# Patient Record
Sex: Male | Born: 1955
Health system: Southern US, Community
[De-identification: ages and names within clinical notes are randomized; demographics above are authoritative.]

## PROBLEM LIST (undated history)

## (undated) DIAGNOSIS — I82409 Acute embolism and thrombosis of unspecified deep veins of unspecified lower extremity: Secondary | ICD-10-CM

## (undated) DIAGNOSIS — R7302 Impaired glucose tolerance (oral): Secondary | ICD-10-CM

## (undated) DIAGNOSIS — E669 Obesity, unspecified: Principal | ICD-10-CM

## (undated) DIAGNOSIS — G4730 Sleep apnea, unspecified: Secondary | ICD-10-CM

## (undated) DIAGNOSIS — I10 Essential (primary) hypertension: Secondary | ICD-10-CM

## (undated) DIAGNOSIS — E785 Hyperlipidemia, unspecified: Secondary | ICD-10-CM

## (undated) DIAGNOSIS — E039 Hypothyroidism, unspecified: Secondary | ICD-10-CM

## (undated) DIAGNOSIS — F419 Anxiety disorder, unspecified: Secondary | ICD-10-CM

## (undated) DIAGNOSIS — E119 Type 2 diabetes mellitus without complications: Secondary | ICD-10-CM

## (undated) DIAGNOSIS — G629 Polyneuropathy, unspecified: Secondary | ICD-10-CM

## (undated) DIAGNOSIS — M1990 Unspecified osteoarthritis, unspecified site: Secondary | ICD-10-CM

## (undated) DIAGNOSIS — N433 Hydrocele, unspecified: Secondary | ICD-10-CM

## (undated) HISTORY — DX: Sleep apnea, unspecified: G47.30

## (undated) HISTORY — DX: Polyneuropathy, unspecified: G62.9

## (undated) HISTORY — DX: Obesity, unspecified: E66.9

## (undated) HISTORY — DX: Essential (primary) hypertension: I10

## (undated) HISTORY — DX: Impaired glucose tolerance (oral): R73.02

## (undated) HISTORY — PX: TONSILLECTOMY: SUR1361

## (undated) HISTORY — PX: SHOULDER SURGERY: SHX246

## (undated) HISTORY — DX: Acute embolism and thrombosis of unspecified deep veins of unspecified lower extremity: I82.409

## (undated) HISTORY — DX: Anxiety disorder, unspecified: F41.9

## (undated) HISTORY — DX: Unspecified osteoarthritis, unspecified site: M19.90

## (undated) HISTORY — DX: Hyperlipidemia, unspecified: E78.5

## (undated) HISTORY — PX: OTHER SURGICAL HISTORY: SHX169

---

## 1990-05-24 DIAGNOSIS — X58XXXA Exposure to other specified factors, initial encounter: Secondary | ICD-10-CM

## 1990-05-24 HISTORY — PX: SHOULDER SURGERY: SHX246

## 1990-05-24 HISTORY — DX: Exposure to other specified factors, initial encounter: X58.XXXA

## 1998-10-30 ENCOUNTER — Ambulatory Visit (HOSPITAL_BASED_OUTPATIENT_CLINIC_OR_DEPARTMENT_OTHER): Admission: RE | Admit: 1998-10-30 | Discharge: 1998-10-30 | Payer: Self-pay | Admitting: Surgery

## 2001-03-21 ENCOUNTER — Ambulatory Visit (HOSPITAL_BASED_OUTPATIENT_CLINIC_OR_DEPARTMENT_OTHER): Admission: RE | Admit: 2001-03-21 | Discharge: 2001-03-21 | Payer: Self-pay | Admitting: Otolaryngology

## 2003-10-16 ENCOUNTER — Ambulatory Visit (HOSPITAL_COMMUNITY): Admission: RE | Admit: 2003-10-16 | Discharge: 2003-10-16 | Payer: Self-pay | Admitting: Orthopedic Surgery

## 2003-12-10 ENCOUNTER — Ambulatory Visit (HOSPITAL_BASED_OUTPATIENT_CLINIC_OR_DEPARTMENT_OTHER): Admission: RE | Admit: 2003-12-10 | Discharge: 2003-12-10 | Payer: Self-pay | Admitting: Orthopedic Surgery

## 2005-10-25 ENCOUNTER — Encounter: Admission: RE | Admit: 2005-10-25 | Discharge: 2005-10-25 | Payer: Self-pay | Admitting: Cardiology

## 2005-10-29 ENCOUNTER — Ambulatory Visit (HOSPITAL_COMMUNITY): Admission: RE | Admit: 2005-10-29 | Discharge: 2005-10-29 | Payer: Self-pay | Admitting: Cardiology

## 2005-12-17 ENCOUNTER — Ambulatory Visit (HOSPITAL_COMMUNITY): Admission: RE | Admit: 2005-12-17 | Discharge: 2005-12-17 | Payer: Self-pay | Admitting: Cardiology

## 2006-02-04 ENCOUNTER — Ambulatory Visit: Payer: Self-pay | Admitting: Pulmonary Disease

## 2007-08-08 IMAGING — CR DG CHEST 2V
2 series · 2 of 2 positions shown · non-contrast
Comparison: None.

CLINICAL DATA: Preop respiratory exam for cardiac cath.

 TWO VIEW CHEST:

[w chest pa]
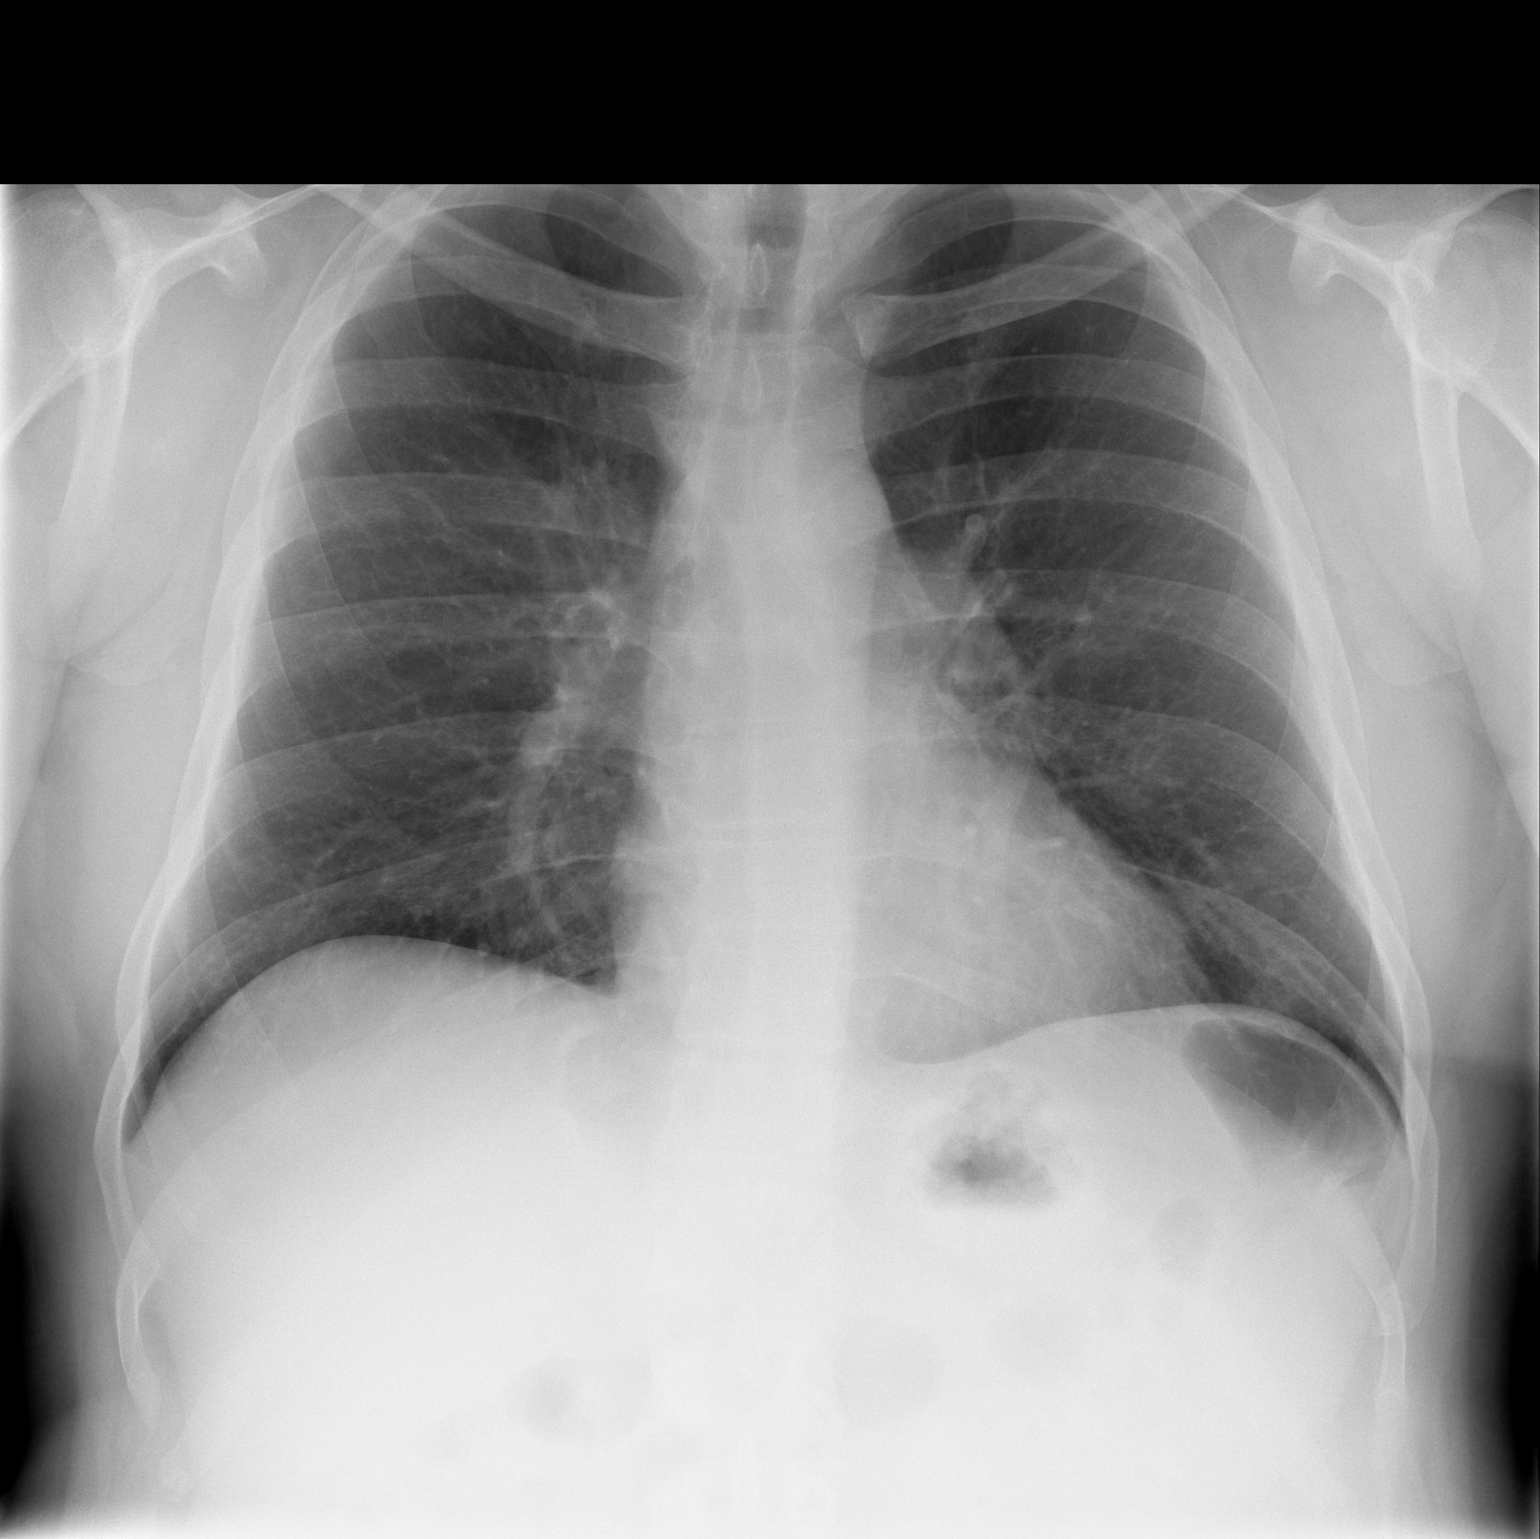

[w chest lat]
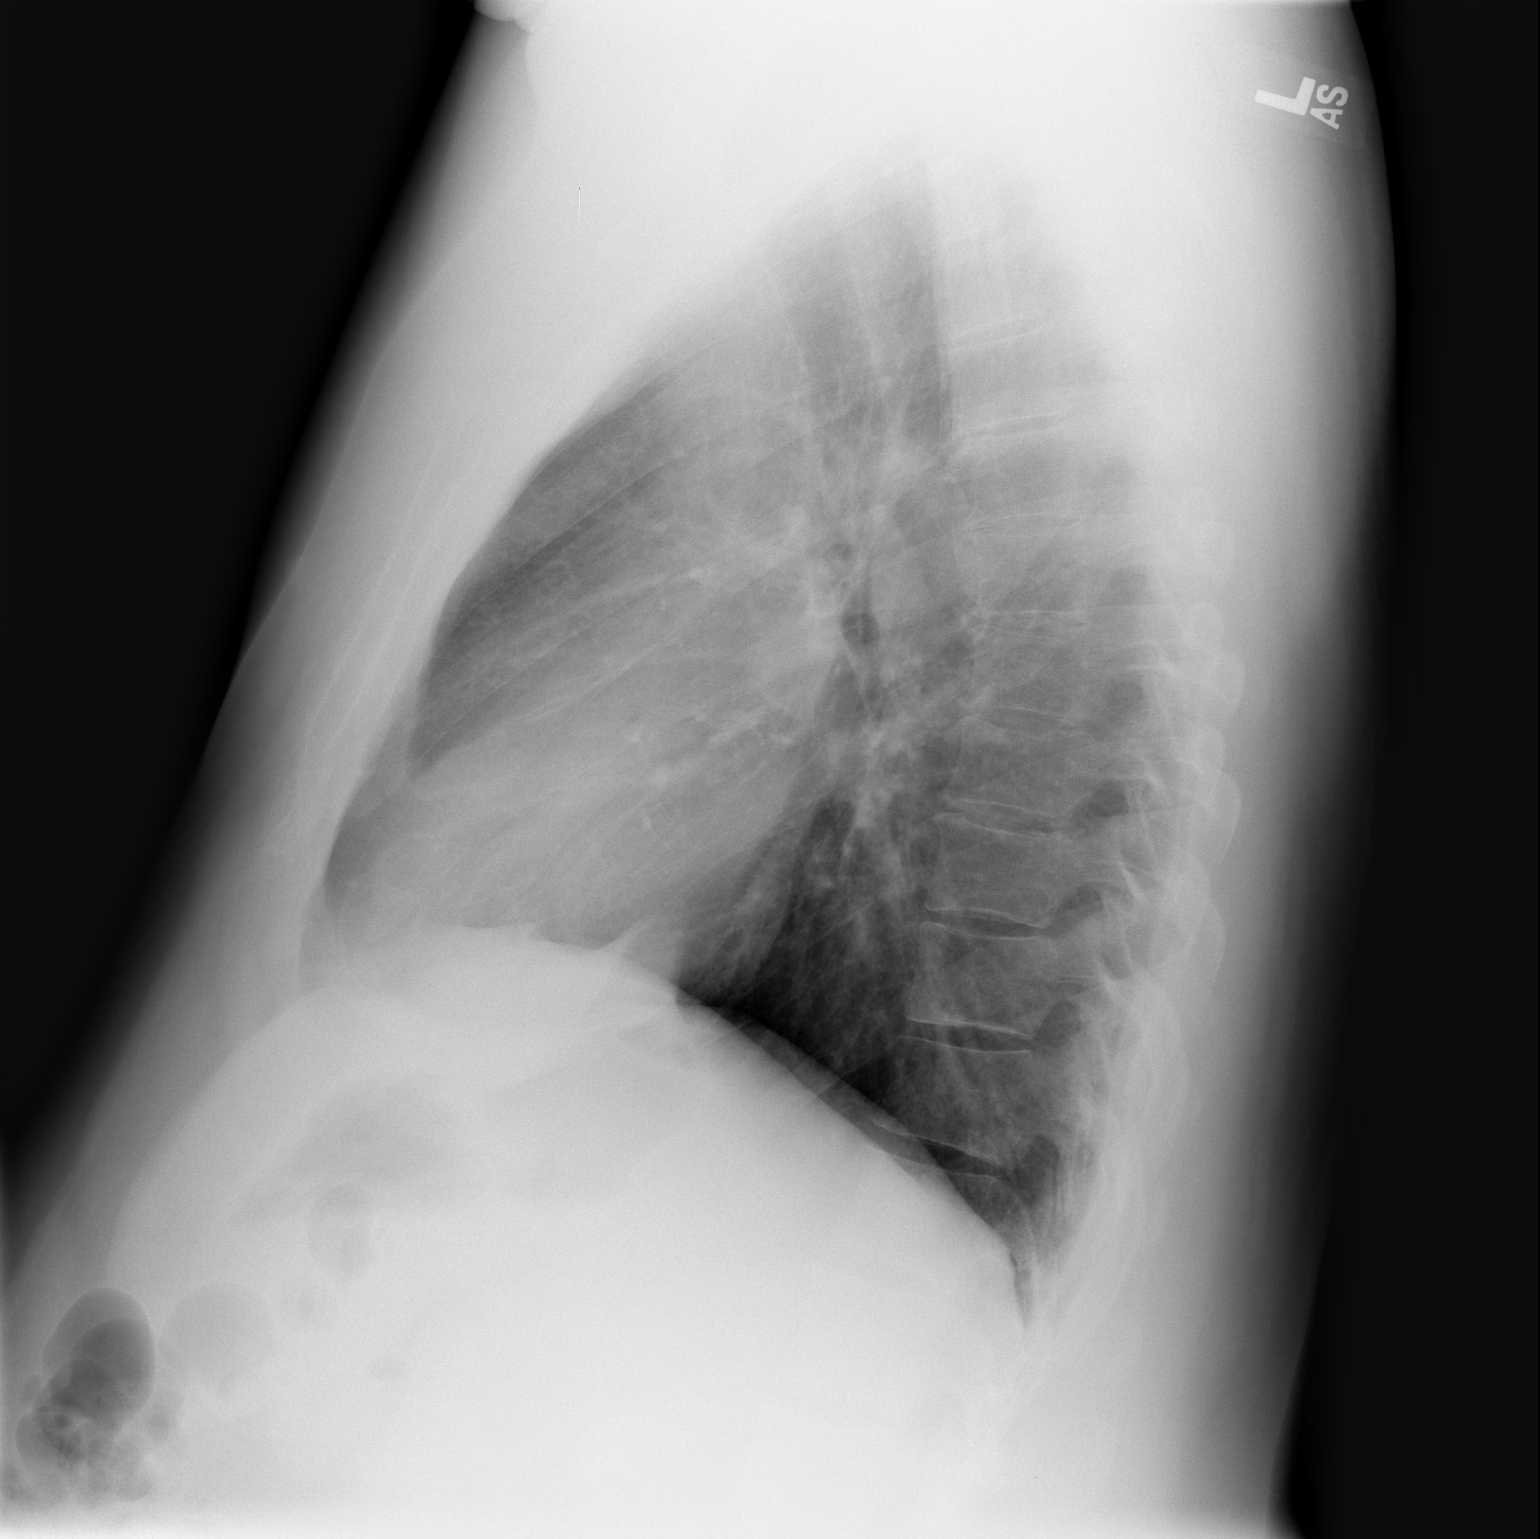

[2 of 2 positions shown; findings below may reference images not displayed]

Cardiac silhouette, mediastinal and hilar contours are within normal limits.  Lungs are clear of acute process.  Small rounded density at the left lung base is most likely nipple shadow.  I do not see a definite symmetric shadowing on the other side and I would recommend a follow-up PA chest with nipple markers.  Bony structures are unremarkable.
IMPRESSION: 1.  No acute cardiopulmonary findings.

 2.  Rounded nodular density at the left lung base is most likely a nipple shadow.  I would recommend a follow-up PA chest film with nipple markers to exclude the possibility of pulmonary nodule.

## 2010-08-13 ENCOUNTER — Encounter: Payer: Self-pay | Admitting: Internal Medicine

## 2010-08-20 NOTE — Letter (Signed)
Summary: New Patient letter  [facility]  [address] [place], [place] [address]   Phone: [phone]  Fax: [phone]       08/13/2010 MRN: [id number]  Darin Chaney [address] [place], [place]  [address]  Dear Darin Chaney,  Welcome to the Gastroenterology Division at [facility]    You are scheduled to see Dr.  Marina Goodell on 09-22-10 at 11am on the 3rd floor at [facility], [address]  We ask that you try to arrive at our office 15 minutes prior to your appointment time to allow for check-in.  We would like you to complete the enclosed self-administered evaluation form prior to your visit and bring it with you on the day of your appointment.  We will review it with you.  Also, please bring a complete list of all your medications or, if you prefer, bring the medication bottles and we will list them.  Please bring your insurance card so that we may make a copy of it.  If your insurance requires a referral to see a specialist, please bring your referral form from your primary care physician.  Co-payments are due at the time of your visit and may be paid by cash, check or credit card.     Your office visit will consist of a consult with your physician (includes a physical exam), any laboratory testing he/she may order, scheduling of any necessary diagnostic testing (e.g. x-ray, ultrasound, CT-scan), and scheduling of a procedure (e.g. Endoscopy, Colonoscopy) if required.  Please allow enough time on your schedule to allow for any/all of these possibilities.    If you cannot keep your appointment, please call [phone] to cancel or reschedule prior to your appointment date.  This allows [place] the opportunity to schedule an appointment for another patient in need of care.  If you do not cancel or reschedule by 5 p.m. the business day prior to your appointment date, you will be charged a $50.00 late cancellation/no-show fee.    Thank you for choosing [facility] for your medical needs.  We appreciate the opportunity to care for you.  Please visit [place] at our website  to learn more about our practice.                     Sincerely,                                                             The Gastroenterology Division

## 2010-09-22 ENCOUNTER — Encounter: Payer: Self-pay | Admitting: Internal Medicine

## 2010-09-22 ENCOUNTER — Ambulatory Visit (INDEPENDENT_AMBULATORY_CARE_PROVIDER_SITE_OTHER): Payer: BC Managed Care – PPO | Admitting: Internal Medicine

## 2010-09-22 DIAGNOSIS — Z1211 Encounter for screening for malignant neoplasm of colon: Secondary | ICD-10-CM

## 2010-09-22 DIAGNOSIS — R1012 Left upper quadrant pain: Secondary | ICD-10-CM

## 2010-09-22 DIAGNOSIS — G4733 Obstructive sleep apnea (adult) (pediatric): Secondary | ICD-10-CM

## 2010-09-22 DIAGNOSIS — K625 Hemorrhage of anus and rectum: Secondary | ICD-10-CM

## 2010-09-22 MED ORDER — PEG-KCL-NACL-NASULF-NA ASC-C 100 G PO SOLR: 1.0000 | Freq: Once | ORAL | Status: AC

## 2010-09-22 NOTE — Patient Instructions (Signed)
Colonoscopy [facility] with Propoful 10/20/10 2:00 pm arrive at 1:00 pm Moviprep prescription sent to pharmacy for you to pick up. Colonoscopy brochure given for you to review.

## 2010-09-22 NOTE — Progress Notes (Signed)
HISTORY OF PRESENT ILLNESS:  Darin Chaney is a 55 y.o. male with obstructive sleep apnea on CPAP, hypertension, obesity, and a history of DVT. He is sent today regarding left upper quadrant pain, change in bowel habits, and rectal bleeding. No prior GI evaluations her GI history. Patient reports developing sharp pain under the left rib cage approximately 2 months ago. He also describes a sensation that his bowel is not emptying well, though he would stop short of constipation. As well he has seen some blood intermittently for several weeks. Grossly light-colored and on the tissue. None recently. He has had unexplained weight loss of about 15 pounds over the past 2-1/2 months. Does report some anal discomfort with defecation at times. His GI review of systems is otherwise negative.  REVIEW OF SYSTEMS:  All non-GI ROS negative.  Past Medical History  Diagnosis Date  . Hypertension   . Obesity   . Osteoarthritis   . IGT (impaired glucose tolerance)   . Peripheral neuropathy   . Sleep apnea     on CPAP  . DVT (deep venous thrombosis)     Past Surgical History  Procedure Date  . Shoulder surgery     Rt.    Social History Darin Chaney  reports that he has been smoking Cigarettes.  He has been smoking about 1 pack per day. He has never used smokeless tobacco. He reports that he does not drink alcohol or use illicit drugs.  family history includes Diabetes in his maternal grandmother and Heart disease in his father and paternal grandfather.  Allergies  Allergen Reactions  . Penicillins Rash       PHYSICAL EXAMINATION: Vital signs: BP 132/84  Pulse 100  Ht 5\' 11"  (1.803 m)  Wt 282 lb 6.4 oz (128.096 kg)  BMI 39.39 kg/m2  Constitutional: generally well-appearing, no acute distress Psychiatric: alert and oriented x3, cooperative Eyes: extraocular movements intact, anicteric, conjunctiva pink Mouth: oral pharynx moist, no lesions Neck: supple no  lymphadenopathy Cardiovascular: heart regular rate and rhythm, no murmur Lungs: clear to auscultation bilaterally Abdomen: soft, nontender, nondistended, no obvious ascites, no peritoneal signs, normal bowel sounds, no organomegaly Rectal: Deferred until colonoscopy Extremities: no lower extremity edema bilaterally Skin: no lesions on visible extremities Neuro: No focal deficits. No asterixis.   ASSESSMENT:  #11. 55 year old gentleman who presents with change in bowel habits, minor intermittent rectal bleeding associated with rectal discomfort, and sharp left upper quadrant pain. I suspect his pain is musculoskeletal. Change in bowel habits nonspecific. The bleeding may be due to fissure or hemorrhoid. However, given the other constellation of complaints as well as unexplained weight loss, rule out neoplasia. #2. obstructive sleep apnea #3. Colorectal cancer screening   PLAN:  #1. Colonoscopy.The nature of the procedure, as well as the risks, benefits, and alternatives were carefully and thoroughly reviewed with the patient. Ample time for discussion and questions allowed. The patient understood, was satisfied, and agreed to proceed. Movi prep prescribed. The patient instructed on its use. The patient is high-risk given his comorbidities and sleep apnea and obesity. For this reason, I have recommended propofol administration under the guidance of a CRNA for his procedural sedation. He agrees.

## 2010-09-23 ENCOUNTER — Encounter: Payer: Self-pay | Admitting: Internal Medicine

## 2010-10-20 ENCOUNTER — Encounter: Payer: Self-pay | Admitting: Internal Medicine

## 2010-10-20 ENCOUNTER — Ambulatory Visit (AMBULATORY_SURGERY_CENTER): Payer: BC Managed Care – PPO | Admitting: Internal Medicine

## 2010-10-20 VITALS — BP 129/79 | HR 73 | Temp 98.4°F | Resp 15 | Ht 71.0 in | Wt 280.0 lb

## 2010-10-20 DIAGNOSIS — K5730 Diverticulosis of large intestine without perforation or abscess without bleeding: Secondary | ICD-10-CM

## 2010-10-20 DIAGNOSIS — D126 Benign neoplasm of colon, unspecified: Secondary | ICD-10-CM

## 2010-10-20 DIAGNOSIS — Z1211 Encounter for screening for malignant neoplasm of colon: Secondary | ICD-10-CM

## 2010-10-20 MED ORDER — SODIUM CHLORIDE 0.9 % IV SOLN: 500.0000 mL | INTRAVENOUS | Status: DC

## 2010-10-20 NOTE — Patient Instructions (Addendum)
DISCHARGE INSTRUCTIONS REVIEWED WITH PATIENT AND CAREPARTNER. PLEASE SEE GREEN AND BLUE SHEETS FOR DISCHARGE INSTRUCTIONS . SAFETY PRECAUTIONS AND DIET RECOMMENDED FOR TODAY REVIEWED. INFORMATION ON HEMORRHOIDS AND HIGH FIBER DIET GIVEN TO CAREPARTNER. INFORMATION ON POLYPS ALSO GIVEN TO PATIENT AND CAREPARTNER.

## 2010-10-21 ENCOUNTER — Telehealth: Payer: Self-pay

## 2010-10-21 NOTE — Telephone Encounter (Signed)

## 2011-08-25 ENCOUNTER — Other Ambulatory Visit: Payer: Self-pay | Admitting: Oncology

## 2012-11-30 ENCOUNTER — Encounter: Payer: Self-pay | Admitting: Neurology

## 2012-11-30 ENCOUNTER — Ambulatory Visit (INDEPENDENT_AMBULATORY_CARE_PROVIDER_SITE_OTHER): Payer: BC Managed Care – PPO | Admitting: Neurology

## 2012-11-30 VITALS — BP 146/93 | HR 94 | Ht 72.0 in | Wt 285.0 lb

## 2012-11-30 DIAGNOSIS — G4733 Obstructive sleep apnea (adult) (pediatric): Secondary | ICD-10-CM | POA: Insufficient documentation

## 2012-11-30 DIAGNOSIS — I10 Essential (primary) hypertension: Secondary | ICD-10-CM

## 2012-11-30 NOTE — Progress Notes (Signed)
Subjective:    Patient ID: Darin Chaney is a 57 y.o. male.  HPI  Huston Foley, MD, PhD [facility] [address] [address] [place], [place] [address]   Dear Dr. Lorain Childes,   I saw your patient, Darin Chaney, upon your kind request in my neurologic clinic today for initial consultation of his sleep disorder, in particular, concern for obstructive sleep apnea. The patient is unaccompanied today. As you know, Darin Chaney is a very pleasant 57 year old right-handed gentleman with a history of hypertension, hyperlipidemia, osteoarthritis, peripheral neuropathy, diastolic dysfunction, obesity and prior diagnosis of obstructive sleep apnea who has had his last sleep study over 10 years ago. His current CPAP machine broke. His DME company is [facility] but her insurance requirements he needs to be reevaluated for his underlying obstructive sleep apnea. He is a Hydrographic surveyor. He indicates wearing CPAP regularly and has been using it for years. He takes it on all his trips. His compliance has been over 70% in the past. He works shifts. His machine broke in 3/14. He is trying to sleep on his side, propping up his head. He was on Ambien and is not taking it now because he is not on CPAP. He has cut down on his smoking.  His typical bedtime is reported to be around 10 PM and usual wake time is around 5 AM. Sleep onset typically occurs within few minutes. He reports feeling not rested upon awakening. He wakes up on an average 3 in the middle of the night and has to go to the bathroom 2 times on a typical night. He admits to occasional morning headaches.  He reports excessive daytime somnolence (EDS) and His Epworth Sleepiness Score (ESS) is 13/24 today. He has not fallen asleep while driving. The patient has been taking a nap, which is usually 20 minutes long. He reports dreaming in a nap and denies feeling refreshed after a nap.  He has been known to snore for  the past many years. Snoring is reportedly marked, and associated with choking sounds and witnessed apneas. The patient admits to a sense of choking or strangling feeling. There is report of nighttime reflux, with rare nighttime cough experienced. The patient has noted any RLS symptoms and is known to kick while asleep or before falling asleep. He is a restless sleeper and in the morning, the bed is quite disheveled.   He denies cataplexy, sleep paralysis, hypnagogic or hypnopompic hallucinations, or sleep attacks. He does not report any vivid dreams, nightmares, dream enactments, or parasomnias, such as sleep talking or sleep walking. He consumes 4 caffeinated beverages per day, usually in the form of coffee.  His CPAP pressure was 10 cm and he was using a FFM. However, he has gained wt and his sleep studies were over 15 years ago and this last machine was over 10 years.   His Past Medical History Is Significant For: Past Medical History  Diagnosis Date  . Hypertension   . Obesity   . Osteoarthritis   . IGT (impaired glucose tolerance)   . Peripheral neuropathy   . Sleep apnea     on CPAP  . DVT (deep venous thrombosis)   . Anxiety   . Hyperlipidemia     His Past Surgical History Is Significant For: Past Surgical History  Procedure Laterality Date  . Shoulder surgery      Rt.  . Surgery from accident  1992,JUNE 1ST    His Family  History Is Significant For: Family History  Problem Relation Age of Onset  . Heart disease Father   . Diabetes Maternal Grandmother   . Heart disease Paternal Grandfather     His Social History Is Significant For: History   Social History  . Marital Status: Married    Spouse Name: N/A    Number of Children: 2  . Years of Education: N/A   Occupational History  . [employer]   .     Social History Main Topics  . Smoking status: Current Every Day Smoker -- 1.00 packs/day    Types: Cigarettes  . Smokeless tobacco: Never Used  . Alcohol  Use: No  . Drug Use: No  . Sexually Active: None   Other Topics Concern  . None   Social History Narrative   Patient lives at home with his wife Darin Chaney and has 3 children.Patient  has a high school education and works at [employer]. Patient smokes 2 packs of cigarettes  a day and drinks caffinted drinks. Patient denies drinking alcohol and taking all illicit drugs.    His Allergies Are:  Allergies  Allergen Reactions  . Penicillins Rash  :   His Current Medications Are:  Outpatient Encounter Prescriptions as of 11/30/2012  Medication Sig Dispense Refill  . diltiazem (DILACOR XR) 120 MG 24 hr capsule       . diltiazem (DILT-CD) 120 MG 24 hr capsule Take 120 mg by mouth daily.        Marland Kitchen escitalopram (LEXAPRO) 20 MG tablet Take 20 mg by mouth daily.        Marland Kitchen ibuprofen (ADVIL,MOTRIN) 200 MG tablet Take 200 mg by mouth every 6 (six) hours as needed.        . pravastatin (PRAVACHOL) 40 MG tablet       . zolpidem (AMBIEN) 10 MG tablet Take 10 mg by mouth at bedtime as needed.         Facility-Administered Encounter Medications as of 11/30/2012  Medication Dose Route Frequency Provider Last Rate Last Dose  . 0.9 %  sodium chloride infusion  500 mL Intravenous Continuous Hilarie Fredrickson, MD      : Review of Systems  Constitutional: Positive for fatigue and unexpected weight change.  Eyes:       Blurred Vision  Respiratory: Positive for shortness of breath.        Snoring  Musculoskeletal:       Joint Pain  Neurological: Positive for headaches.  Hematological: Bruises/bleeds easily.  Psychiatric/Behavioral: Positive for sleep disturbance.       Decreased Energy,Racing Thought,Restless legs,    Objective:  Neurologic Exam  Physical Exam Physical Examination:   Filed Vitals:   11/30/12 1044  BP: 146/93  Pulse: 94    General Examination: The patient is a very pleasant 57 y.o. male in no acute distress. He appears well-developed and well-nourished and adequately  groomed.   HEENT: Normocephalic, atraumatic, pupils are equal, round and reactive to light and accommodation. Funduscopic exam is normal with sharp disc margins noted. Extraocular tracking is good without limitation to gaze excursion or nystagmus noted. Normal smooth pursuit is noted. Hearing is grossly intact. Tympanic membranes are clear bilaterally. Face is symmetric with normal facial animation and normal facial sensation. Speech is clear with no dysarthria noted. There is no hypophonia. There is no lip, neck/head, jaw or voice tremor. Neck is supple with full range of passive and active motion. There are no carotid bruits on auscultation.  Oropharynx exam reveals: mild mouth dryness, adequate dental hygiene and marked airway crowding, due to thick soft palate, narrow airway entry and large uvula. Mallampati is class III. Tongue protrudes centrally and palate elevates symmetrically. Tonsils are absent. Neck size is 19.5 inches.   Chest: Clear to auscultation without wheezing, rhonchi or crackles noted.  Heart: S1+S2+0, regular and normal without murmurs, rubs or gallops noted.   Abdomen: Soft, non-tender and non-distended with normal bowel sounds appreciated on auscultation.  Extremities: There is no pitting edema in the distal lower extremities bilaterally. Pedal pulses are intact. Ankles were fused.  Skin: Warm and dry without trophic changes noted. There are no varicose veins.  Musculoskeletal: exam reveals no obvious joint deformities, tenderness or joint swelling or erythema.   Neurologically:  Mental status: The patient is awake, alert and oriented in all 4 spheres. His memory, attention, language and knowledge are appropriate. There is no aphasia, agnosia, apraxia or anomia. Speech is clear with normal prosody and enunciation. Thought process is linear. Mood is congruent and affect is normal.  Cranial nerves are as described above under HEENT exam. In addition, shoulder shrug is normal  with equal shoulder height noted. Motor exam: Normal bulk, strength and tone is noted. There is no drift, tremor or rebound. Romberg is negative. Reflexes are 1+ throughout with absent ankle jerks. Toes are downgoing bilaterally. Fine motor skills are intact with normal finger taps, normal hand movements, normal rapid alternating patting, normal foot taps and normal foot agility.  Cerebellar testing shows no dysmetria or intention tremor on finger to nose testing. Heel to shin is unremarkable bilaterally. There is no truncal or gait ataxia.  Sensory exam is intact to light touch, pinprick, vibration, temperature sense and proprioception in the upper and lower extremities, with the exception of patchy decrease in temperature sense in the distal lower extremities. Of note, he has had some postsurgical changes to his extremities.  Gait, station and balance are unremarkable. No veering to one side is noted. No leaning to one side is noted. Posture is age-appropriate and stance is narrow based. No problems turning are noted. He turns en bloc. Tandem walk is difficult. Intact toe and heel stance is noted, briefly.               Assessment and Plan:   In summary, Darin Chaney is a very pleasant 57 y.o.-year old male with a history and physical exam concerning for obstructive sleep apnea (OSA). I had a long chat with the patient about my findings and the diagnosis, its prognosis and treatment options. We talked about medical treatments and non-pharmacological approaches. I explained in particular the risks and ramifications of untreated moderate to severe OSA, especially with respect to developing cardiovascular disease down the Road, including congestive heart failure, difficult to treat hypertension, cardiac arrhythmias, or stroke. Even type 2 diabetes has in part been linked to untreated OSA. We talked about smoking cessation and trying to maintain a healthy lifestyle in general, as well as the importance of  weight control. I encouraged the patient to eat healthy, exercise daily and keep well hydrated, to keep a scheduled bedtime and wake time routine, to not skip any meals and eat healthy snacks in between meals.  I recommended the following at this time: sleep study with CPAP titration.  I explained the sleep test procedure to the patient and also outlined surgical and non-surgical treatment options of OSA including the use of a dental custom-made appliance, upper airway surgery such  as pillar implants, radiofrequency surgery, tongue base surgery, and UPPP. I also explained the CPAP treatment option to the patient, who indicated that he would be willing to try CPAP if the need arises. I explained the importance of being compliant with PAP treatment, not only for insurance purposes but primarily for the patient's long term health benefit. I answered all his questions today and the patient was in agreement. I would like to see him back after the sleep study is completed and encouraged him to call with any interim questions, concerns, problems or updates.   Thank you very much for allowing me to participate in the care of this nice patient. If I can be of any further assistance to you please do not hesitate to call me at [phone].  Sincerely,   Huston Foley, MD, PhD

## 2012-11-30 NOTE — Patient Instructions (Signed)

## 2012-12-05 ENCOUNTER — Ambulatory Visit (INDEPENDENT_AMBULATORY_CARE_PROVIDER_SITE_OTHER): Payer: BC Managed Care – PPO | Admitting: Neurology

## 2012-12-05 VITALS — BP 142/88 | HR 86 | Ht 72.0 in | Wt 285.0 lb

## 2012-12-05 DIAGNOSIS — G4761 Periodic limb movement disorder: Secondary | ICD-10-CM

## 2012-12-05 DIAGNOSIS — G479 Sleep disorder, unspecified: Secondary | ICD-10-CM

## 2012-12-05 DIAGNOSIS — G4710 Hypersomnia, unspecified: Secondary | ICD-10-CM

## 2012-12-05 DIAGNOSIS — G4730 Sleep apnea, unspecified: Secondary | ICD-10-CM

## 2012-12-05 DIAGNOSIS — G4733 Obstructive sleep apnea (adult) (pediatric): Secondary | ICD-10-CM

## 2012-12-08 ENCOUNTER — Other Ambulatory Visit: Payer: Self-pay | Admitting: Neurology

## 2012-12-08 ENCOUNTER — Telehealth: Payer: Self-pay | Admitting: Neurology

## 2012-12-08 DIAGNOSIS — G4733 Obstructive sleep apnea (adult) (pediatric): Secondary | ICD-10-CM

## 2012-12-08 DIAGNOSIS — G4761 Periodic limb movement disorder: Secondary | ICD-10-CM

## 2012-12-08 NOTE — Telephone Encounter (Signed)
Please call and notify patient that the recent sleep study confirmed the diagnosis of OSA. He did very well with CPAP during the study with significant improvement of the respiratory events. Therefore, I would like start the patient on CPAP at home. I placed the order in the chart.   Arrange for CPAP set up at home through a DME company of patient's choice - I think he has a company from before - and fax/route report to PCP and referring MD (if other than PCP).   The patient will also need a follow up appointment with me in 6-8 weeks post set up that has to be scheduled; help the patient schedule this (in a follow-up slot).   Please re-enforce the importance of compliance with treatment and the need for [place] to monitor compliance data.   Once you have spoken to the patient and scheduled the return appointment, you may close this encounter, thanks,   Huston Foley, MD, PhD [facility] ([facility]

## 2012-12-28 ENCOUNTER — Encounter (INDEPENDENT_AMBULATORY_CARE_PROVIDER_SITE_OTHER): Payer: Self-pay | Admitting: General Surgery

## 2012-12-28 ENCOUNTER — Ambulatory Visit (INDEPENDENT_AMBULATORY_CARE_PROVIDER_SITE_OTHER): Payer: BC Managed Care – PPO | Admitting: General Surgery

## 2012-12-28 VITALS — BP 134/84 | HR 81 | Temp 99.5°F | Resp 18 | Ht 72.0 in | Wt 286.0 lb

## 2012-12-28 DIAGNOSIS — K432 Incisional hernia without obstruction or gangrene: Secondary | ICD-10-CM

## 2012-12-28 NOTE — Progress Notes (Signed)
Subjective:     Patient ID: Darin Chaney, male   DOB: [birth year], 57 y.o.   MRN: [id number]  HPI The patient is a 57 year old is referred for evaluation by Dr. Vladimir Crofts evaluation of incisional hernia. The patient had a previous ex-lap secondary to a trauma approximately 10 years ago. The patient stated he underwent bladder surgery as well as pelvic surgery. The patient subsequently had a incisional hernia which has become symptomatic over the last several months.  The patient does state he is a smoker approximately 2 packs a day.  Review of Systems  Constitutional: Negative.   HENT: Negative.   Respiratory: Negative.   Cardiovascular: Negative.   Gastrointestinal: Positive for abdominal pain.  Neurological: Negative.        Objective:   Physical Exam  Constitutional: He appears well-developed and well-nourished.  HENT:  Head: Normocephalic and atraumatic.  Eyes: Conjunctivae and EOM are normal. Pupils are equal, round, and reactive to light.  Neck: Normal range of motion. Neck supple.  Cardiovascular: Normal rate and normal heart sounds.   Abdominal: Soft. Bowel sounds are normal. There is tenderness (superior aspect of incision). A hernia is present. Hernia confirmed positive in the ventral area.    Approximate 3-4 cm incisional hernia at the superior aspect of his previous  exploratory laparoscopy incision  Musculoskeletal: Normal range of motion.  Neurological: He is alert.  Skin: Skin is warm and dry.       Assessment:     57 year old male with an incisional hernia.     Plan:     1. I Discussed with the patient secondary his weight as well as his smoking history he would not be a good idea to repair his hernia at this time. The chance of recurrence would be very high secondary to his comorbidities. We discussed the need to cut back on smoking and quit. He also discussed with him the need to loose weight approximately 20-25 pounds. He feels that when he gets to  that point he can come back for reconsultation for surgery.  2. I discussed with them the signs and symptoms of acute incarceration and the need to proceed to the ED should these occur.

## 2013-01-02 ENCOUNTER — Encounter (INDEPENDENT_AMBULATORY_CARE_PROVIDER_SITE_OTHER): Payer: Self-pay

## 2013-02-07 ENCOUNTER — Encounter: Payer: Self-pay | Admitting: Neurology

## 2013-02-12 ENCOUNTER — Ambulatory Visit (INDEPENDENT_AMBULATORY_CARE_PROVIDER_SITE_OTHER): Payer: BC Managed Care – PPO | Admitting: Neurology

## 2013-02-12 ENCOUNTER — Encounter: Payer: Self-pay | Admitting: Neurology

## 2013-02-12 VITALS — BP 135/79 | HR 71 | Temp 98.1°F | Ht 72.0 in | Wt 288.0 lb

## 2013-02-12 DIAGNOSIS — G4733 Obstructive sleep apnea (adult) (pediatric): Secondary | ICD-10-CM

## 2013-02-12 DIAGNOSIS — I10 Essential (primary) hypertension: Secondary | ICD-10-CM

## 2013-02-12 DIAGNOSIS — G4761 Periodic limb movement disorder: Secondary | ICD-10-CM

## 2013-02-12 NOTE — Patient Instructions (Signed)
Please continue using your CPAP regularly. While your insurance requires that you use CPAP at least 4 hours each night on 70% of the nights, I recommend, that you not skip any nights and use it throughout the night if you can. Getting used to CPAP does take time and patience and discipline. Untreated obstructive sleep apnea when it is moderate to severe can have an adverse impact on cardiovascular health and raise her risk for heart disease, arrhythmias, hypertension, congestive heart failure, stroke and diabetes. Untreated obstructive sleep apnea causes sleep disruption, nonrestorative sleep, and sleep deprivation. This can have an impact on your day to day functioning and cause daytime sleepiness and impairment of cognitive function, memory loss, mood disturbance, and problems focussing. Using CPAP regularly can improve these symptoms.   

## 2013-02-12 NOTE — Progress Notes (Signed)
Subjective:    Patient ID: Darin Chaney is a 57 y.o. male.  HPI  Interim history:   Darin Chaney is a very pleasant 57 year old right-handed gentleman with a history of hypertension, hyperlipidemia, osteoarthritis, peripheral neuropathy, diastolic dysfunction, obesity and prior diagnosis of obstructive sleep apnea who presents for followup consultation of his obstructive sleep apnea. I first met him on 11/30/2012, which time he reported a diagnosis of obstructive sleep apnea with his last sleep study over 10 years ago. He also reported that his CPAP machine had broken. He is a Hydrographic surveyor. I asked him to come back for a split-night sleep study and went over his test results with him in detail today. He had a split-night sleep study on 12/05/2012 which showed a sleep efficiency a baseline of 74.6% with a latency to sleep of 21 minutes. He had a elevated arousal index of 60.9 per hour. He had an increased percentage of stage I and 2 sleep and absence of slow-wave and REM sleep. He had a baseline AHI of 50.6 per hour. His baseline oxygen saturation was 92%, his nadir was 86%. He was titrated on CPAP from 5-15 cm of water with a residual AHI of 0 per hour at the final pressure. I reviewed his compliance to his as well from 12/18/2012 to 01/16/2013 which is a total of 30 days during which time he used it every day. His average usage for all days was 7 hours and 59 minutes and his percent used days greater than 4 hours was under percent indicating excellent compliance. His residual AHI was 1.5 per hour indicating an appropriate CPAP pressure of 15 with EPR of 3. He had severe PLMs before and after CPAP and does endorse some mild RLS Sx and his wife has reported that he kicks in his sleep. I also reviewed his compliance data from 01/11/2013 through 02/11/2013 which is a total of 32 days during which time he used it every day except 2 days. Average usage for all days was 8 hours and 10 minutes.  Percent used days greater than 4 hours was 94 percent, indicating excellent compliance. His residual AHI was 1.9 per hour, indicating an appropriate CPAP pressure of 15 cm with EPR of 3. He has an umbilical hernia and has not stopped smoking. His surgeon has told him that he has to lose weight and decrease his smoking before he can have surgery for his umbilical hernia. He feels greatly improved after his CPAP is adjusted. He feels much more refreshed and sleeps well. He has no complaints regarding the pressure which was increased from his original pressure of 10-15 cm. This has helped a lot. He had some initial adjustment problem to the mask and after trimming his beard he has noticed much better seal.  His Past Medical History Is Significant For: Past Medical History  Diagnosis Date  . Hypertension   . Obesity   . Osteoarthritis   . IGT (impaired glucose tolerance)   . Peripheral neuropathy   . Sleep apnea     on CPAP  . DVT (deep venous thrombosis)   . Anxiety   . Hyperlipidemia     His Past Surgical History Is Significant For: Past Surgical History  Procedure Laterality Date  . Shoulder surgery      Rt.  . Surgery from accident  1992,JUNE 1ST    His Family History Is Significant For: Family History  Problem Relation Age of Onset  . Heart disease Father   .  Diabetes Maternal Grandmother   . Heart disease Paternal Grandfather     His Social History Is Significant For: History   Social History  . Marital Status: Married    Spouse Name: N/A    Number of Children: 2  . Years of Education: N/A   Occupational History  . [employer]   .     Social History Main Topics  . Smoking status: Current Every Day Smoker -- 1.00 packs/day    Types: Cigarettes  . Smokeless tobacco: Never Used  . Alcohol Use: No  . Drug Use: No  . Sexual Activity: None   Other Topics Concern  . None   Social History Narrative   Patient lives at home with his wife Darin Chaney and has  3 children.Patient  has a high school education and works at [employer]. Patient smokes 2 packs of cigarettes  a day and drinks caffinted drinks. Patient denies drinking alcohol and taking all illicit drugs.    His Allergies Are:  Allergies  Allergen Reactions  . Penicillins Rash  :   His Current Medications Are:  Outpatient Encounter Prescriptions as of 02/12/2013  Medication Sig Dispense Refill  . diltiazem (DILACOR XR) 120 MG 24 hr capsule       . diltiazem (DILT-CD) 120 MG 24 hr capsule Take 120 mg by mouth daily.        Marland Kitchen escitalopram (LEXAPRO) 20 MG tablet Take 20 mg by mouth daily.        Marland Kitchen ibuprofen (ADVIL,MOTRIN) 200 MG tablet Take 200 mg by mouth every 6 (six) hours as needed.        . pravastatin (PRAVACHOL) 40 MG tablet       . zolpidem (AMBIEN) 10 MG tablet Take 10 mg by mouth at bedtime as needed.         Facility-Administered Encounter Medications as of 02/12/2013  Medication Dose Route Frequency Provider Last Rate Last Dose  . 0.9 %  sodium chloride infusion  500 mL Intravenous Continuous Hilarie Fredrickson, MD      : Review of Systems  Eyes: Positive for visual disturbance (blurred vision).  Respiratory:       Snoring  Hematological: Bruises/bleeds easily.    Objective:  Neurologic Exam  Physical Exam Physical Examination:   Filed Vitals:   02/12/13 0827  BP: 135/79  Pulse: 71  Temp: 98.1 F (36.7 C)    General Examination: The patient is a very pleasant 57 y.o. male in no acute distress. He appears well-developed and well-nourished and adequately groomed. He is obese.  HEENT: Normocephalic, atraumatic, pupils are equal, round and reactive to light and accommodation.Extraocular tracking is good without limitation to gaze excursion or nystagmus noted. Normal smooth pursuit is noted. Hearing is grossly intact. Face is symmetric with normal facial animation and normal facial sensation. Speech is clear with no dysarthria noted. There is no hypophonia. There  is no lip, neck/head, jaw or voice tremor. Neck is supple with full range of passive and active motion. There are no carotid bruits on auscultation.   Chest: Clear to auscultation without wheezing, rhonchi or crackles noted.  Heart: S1+S2+0, regular and normal without murmurs, rubs or gallops noted.   Abdomen: Soft, non-tender and non-distended with normal bowel sounds appreciated on auscultation.  Extremities: There is no pitting edema in the distal lower extremities bilaterally. Pedal pulses are intact. Ankles were fused.  Skin: Warm and dry without trophic changes noted. There are no varicose veins.  Musculoskeletal: exam  reveals no obvious joint deformities, tenderness or joint swelling or erythema.   Neurologically:  Mental status: The patient is awake, alert and oriented in all 4 spheres. His memory, attention, language and knowledge are appropriate. There is no aphasia, agnosia, apraxia or anomia. Speech is clear with normal prosody and enunciation. Thought process is linear. Mood is congruent and affect is normal.  Cranial nerves are as described above under HEENT exam. In addition, shoulder shrug is normal with equal shoulder height noted. Motor exam: Normal bulk, strength and tone is noted. There is no drift, tremor or rebound. Reflexes are 1+ throughout with absent ankle jerks. Fine motor skills are intact.  Cerebellar testing shows no dysmetria or intention tremor. There is no truncal or gait ataxia.  Sensory exam is intact to light touch. Of note, he has had some postsurgical changes to his extremities.  Gait, station and balance are unremarkable.          Assessment: and Plan:   In summary, Darin Chaney is a very pleasant 57 y.o.-year old male with a history of OSA, on CPAP treatment at a pressure of 15 cwp. His physical exam is stable and He indicates very good results with the use of CPAP, and good tolerance of the pressure and mask. I reviewed the compliance data with the  patient and encouraged him to continue to use CPAP regularly to help reduce cardiovascular risk.  I congratulated him on his very good compliance. I encouraged him to quit smoking and lose weight. We talked about his periodic leg movements and mild RLS symptoms. Symptoms are not every night and he does not take every night he states. I explained to him that his Lexapro may have in part something to do with his RLS symptoms and has leg kicking. We mutually decided to hold off on any treatment of his RLS and PLMS. We also talked about trying to maintaining a healthy lifestyle in general. I encouraged the patient to eat healthy, exercise daily and keep well hydrated, to keep a scheduled bedtime and wake time routine, to not skip any meals and eat healthy snacks in between meals and to have protein with every meal. I stressed the importance of regular exercise.   I answered all his questions today and the patient was in agreement with the above outlined plan. I would like to see the patient back in 6 months, sooner if the need arises and encouraged him to call with any interim questions, concerns, problems or updates.

## 2013-02-13 ENCOUNTER — Encounter: Payer: Self-pay | Admitting: Neurology

## 2013-03-27 ENCOUNTER — Encounter: Payer: Self-pay | Admitting: Neurology

## 2013-03-28 NOTE — Progress Notes (Signed)
Quick Note:  I reviewed the patient's CPAP compliance data from 02/14/2013 to 03/15/2013, which is a total of 30 days, during which time the patient used CPAP every day. The average usage for all days was 7 hours and 51 minutes. The percent used days greater than 4 hours was 100 %, indicating excellent compliance. The residual AHI was 1.5 per hour, indicating an appropriate treatment pressure of 15 cwp with EPR of 3. I will review this data with the patient at the next office visit, provide feedback and additional troubleshooting if need be.  Huston Foley, MD, PhD [facility] ([facility]   ______

## 2013-04-02 ENCOUNTER — Encounter: Payer: Self-pay | Admitting: Neurology

## 2013-05-24 HISTORY — PX: OTHER SURGICAL HISTORY: SHX169

## 2013-08-14 ENCOUNTER — Encounter (INDEPENDENT_AMBULATORY_CARE_PROVIDER_SITE_OTHER): Payer: Self-pay

## 2013-08-14 ENCOUNTER — Ambulatory Visit (INDEPENDENT_AMBULATORY_CARE_PROVIDER_SITE_OTHER): Payer: BC Managed Care – PPO | Admitting: Neurology

## 2013-08-14 ENCOUNTER — Encounter: Payer: Self-pay | Admitting: Neurology

## 2013-08-14 VITALS — BP 142/87 | HR 85 | Temp 96.7°F | Ht 72.0 in | Wt 296.0 lb

## 2013-08-14 DIAGNOSIS — F17200 Nicotine dependence, unspecified, uncomplicated: Secondary | ICD-10-CM

## 2013-08-14 DIAGNOSIS — G4733 Obstructive sleep apnea (adult) (pediatric): Secondary | ICD-10-CM

## 2013-08-14 DIAGNOSIS — E669 Obesity, unspecified: Secondary | ICD-10-CM

## 2013-08-14 DIAGNOSIS — I10 Essential (primary) hypertension: Secondary | ICD-10-CM

## 2013-08-14 HISTORY — DX: Obesity, unspecified: E66.9

## 2013-08-14 NOTE — Progress Notes (Signed)
Subjective:    Patient ID: Darin Chaney is a 58 y.o. male.  HPI    Interim history:   Darin Chaney is a very pleasant 58 year old right-handed gentleman with a history of hypertension, hyperlipidemia, osteoarthritis, peripheral neuropathy, diastolic dysfunction, obesity and OSA, who presents for followup consultation of his obstructive sleep apnea. He is unaccompanied today. I last saw him on 02/12/2013, at which time I reviewed his compliance data with him and congratulated him on his good compliance. He felt much better with CPAP and indicated very good results as well as good tolerance of the mask and pressure. We talked about his leg movements and RLS symptoms. I told him that his Lexapro may be in part to blame for his RLS and periodic leg movements. He has an umbilical hernia and has not yet stopped smoking. His surgeon has told him that he has to lose weight and decrease his smoking before he can have surgery for his umbilical hernia. I reviewed compliance data from 02/14/2013 to 03/15/2013, which is a total of 30 days, during which time the patient used CPAP every day. The average usage for all days was 7 hours and 51 minutes. The percent used days greater than 4 hours was 100 %, indicating excellent compliance. The residual AHI was 1.5 per hour, indicating an appropriate treatment pressure of 15 cwp with EPR of 3.   Today, I reviewed his compliance data from 07/13/2013 through 08/11/2013 which is a total of 30 days during which time he uses CPAP every night with 100% compliance. Average usage was 8 hours and 48 minutes. Residual AHI was 1.6 and a pressure of 15 with EPR of 3. His sleep was quite high. 95th percentile oblique was 73.8 L per minute. Today, he reports doing very well with CPAP, he uses it nightly and is very pleased with his new machine. He has minimal RLS symptoms. He has not lost any weight. He still smokes about 1 1/2 ppd. He drives a cement mixer truck.   I first met him on  11/30/2012, which time he reported a diagnosis of obstructive sleep apnea with his last sleep study over 10 years ago. He also reported that his CPAP machine had broken. He is a Production designer, theatre/television/film. I asked him to come back for a split-night sleep study and went over his test results with him in detail before. He had a split-night sleep study on 12/05/2012: baseline sleep efficiency was reduced at 74.6% with a latency to sleep of 21 minutes. He had a elevated arousal index of 60.9 per hour. He had an increased percentage of stage I and 2 sleep and absence of slow-wave and REM sleep. He had a baseline AHI of 50.6 per hour. His baseline oxygen saturation was 92%, his nadir was 86%. He was titrated on CPAP from 5-15 cm of water with a residual AHI of 0 per hour at the final pressure. I reviewed his compliance from 12/18/2012 to 01/16/2013 (30 days), during which time he used it every day. His average usage for all days was 7 hours and 59 minutes and his percent used days greater than 4 hours was under percent indicating excellent compliance. His residual AHI was 1.5 per hour indicating an appropriate CPAP pressure of 15 with EPR of 3.  He had severe PLMs before and after CPAP and endorsed some mild RLS Sx and his wife has reported that he kicks in his sleep.  I also reviewed his compliance data from 01/11/2013 through  02/11/2013 (32 days), during which time he used it every day except 2 days. Average usage for all days was 8 hours and 10 minutes. Percent used days greater than 4 hours was 94 percent, indicating excellent compliance. His residual AHI was 1.9 per hour, indicating an appropriate CPAP pressure of 15 cm with EPR of 3.   His Past Medical History Is Significant For: Past Medical History  Diagnosis Date  . Hypertension   . Obesity   . Osteoarthritis   . IGT (impaired glucose tolerance)   . Peripheral neuropathy   . Sleep apnea     on CPAP  . DVT (deep venous thrombosis)   . Anxiety   .  Hyperlipidemia   . Obesity, unspecified 08/14/2013    His Past Surgical History Is Significant For: Past Surgical History  Procedure Laterality Date  . Shoulder surgery      Rt.  . Surgery from accident  1992,JUNE 1ST    His Family History Is Significant For: Family History  Problem Relation Age of Onset  . Heart disease Father   . Diabetes Maternal Grandmother   . Heart disease Paternal Grandfather     His Social History Is Significant For: History   Social History  . Marital Status: Married    Spouse Name: N/A    Number of Children: 2  . Years of Education: N/A   Occupational History  . [employer]   .     Social History Main Topics  . Smoking status: Current Every Day Smoker -- 1.00 packs/day    Types: Cigarettes  . Smokeless tobacco: Never Used  . Alcohol Use: No  . Drug Use: No  . Sexual Activity: None   Other Topics Concern  . None   Social History Narrative   Patient lives at home with his wife Darin Chaney and has 3 children.Patient  has a high school education and works at [employer]. Patient smokes 2 packs of cigarettes  a day and drinks caffinted drinks. Patient denies drinking alcohol and taking all illicit drugs.    His Allergies Are:  Allergies  Allergen Reactions  . Penicillins Rash  :   His Current Medications Are:  Outpatient Encounter Prescriptions as of 08/14/2013  Medication Sig  . diltiazem (DILACOR XR) 120 MG 24 hr capsule   . diltiazem (DILT-CD) 120 MG 24 hr capsule Take 120 mg by mouth daily.    Marland Kitchen escitalopram (LEXAPRO) 20 MG tablet Take 20 mg by mouth daily.    Marland Kitchen ibuprofen (ADVIL,MOTRIN) 200 MG tablet Take 200 mg by mouth every 6 (six) hours as needed.    . pravastatin (PRAVACHOL) 40 MG tablet   . zolpidem (AMBIEN) 10 MG tablet Take 10 mg by mouth at bedtime as needed.    :  Review of Systems:  Out of a complete 14 point review of systems, all are reviewed and negative with the exception of these symptoms as listed  below:   Review of Systems  Constitutional: Negative.   HENT: Negative.   Eyes: Positive for redness, itching and visual disturbance (loss of vision).  Respiratory: Negative.   Cardiovascular: Negative.   Gastrointestinal: Negative.   Endocrine: Negative.   Genitourinary: Negative.   Musculoskeletal: Positive for arthralgias.  Skin: Positive for wound (right forearm).  Allergic/Immunologic: Negative.   Neurological: Negative.   Hematological: Bruises/bleeds easily.  Psychiatric/Behavioral: Positive for sleep disturbance (snoring, restless legs) and dysphoric mood. The patient is nervous/anxious.     Objective:  Neurologic Exam  Physical Exam Physical Examination:   Filed Vitals:   08/14/13 1141  BP: 142/87  Pulse: 85  Temp: 96.7 F (35.9 C)    General Examination: The patient is a very pleasant 58 y.o. male in no acute distress. He appears well-developed and well-nourished and adequately groomed. He is obese.  HEENT: Normocephalic, atraumatic, pupils are equal, round and reactive to light and accommodation. Funduscopic exam is unremarkable. Extraocular tracking is good without limitation to gaze excursion or nystagmus noted. Normal smooth pursuit is noted. Hearing is grossly intact. Face is symmetric with normal facial animation and normal facial sensation. Speech is clear with no dysarthria noted. There is no hypophonia. There is no lip, neck/head, jaw or voice tremor. Neck is supple with full range of passive and active motion. There are no carotid bruits on auscultation.   Chest: Clear to auscultation without wheezing, rhonchi or crackles noted.  Heart: S1+S2+0, regular and normal without murmurs, rubs or gallops noted.   Abdomen: Soft, non-tender and non-distended with normal bowel sounds appreciated on auscultation.  Extremities: There is no pitting edema in the distal lower extremities bilaterally. Pedal pulses are intact. Ankles were fused.  Skin: Warm and dry  without trophic changes noted. There are no varicose veins. He has some old-appearing bruises on his forearms and dorsum of his hands.  Musculoskeletal: exam reveals no obvious joint deformities, tenderness or joint swelling or erythema.   Neurologically:  Mental status: The patient is awake, alert and oriented in all 4 spheres. His memory, attention, language and knowledge are appropriate. There is no aphasia, agnosia, apraxia or anomia. Speech is clear with normal prosody and enunciation. Thought process is linear. Mood is congruent and affect is normal.  Cranial nerves are as described above under HEENT exam. In addition, shoulder shrug is normal with equal shoulder height noted. Motor exam: Normal bulk, strength and tone is noted. There is no drift, tremor or rebound. Reflexes are 1+ throughout with absent ankle jerks. Fine motor skills are intact.  Cerebellar testing shows no dysmetria or intention tremor. There is no truncal or gait ataxia.  Sensory exam is intact to light touch. Gait, station and balance are unremarkable.           Assessment and Plan:   In summary, ARCH METHOT is a very pleasant 58 y.o.-year old male with an underlying medical history of hypertension, hyperlipidemia, osteoarthritis, peripheral neuropathy, diastolic dysfunction, smoking and obesity, who presents for followup consultation of his severe obstructive sleep apnea, on treatment with CPAP of 15 cm with EPR of 3. He indicates ongoing great results with CPAP and is fully compliant with treatment. I talked to him about his sleep test results again and reviewed his compliance with him. I congratulated him on his excellent compliance. He does have a residual high leak. This may be in part because of his facial hair. His residual AHI is low. I asked him to continue to be compliant with CPAP. I encouraged him to meet with his DME provider to troubleshoot the mask air leak. Otherwise, we talked about smoking cessation  today and I encouraged him to try to lose weight.    We also talked about trying to maintaining a healthy lifestyle in general. I encouraged the patient to eat healthy, exercise daily and keep well hydrated, to keep a scheduled bedtime and wake time routine, to not skip any meals and eat healthy snacks in between meals and to have protein with every meal. I stressed the importance of  regular exercise.   I answered all his questions today and the patient was in agreement with the above outlined plan. I would like to see the patient back in 12 months, sooner if the need arises and encouraged him to call with any interim questions, concerns, problems or updates.

## 2013-08-14 NOTE — Patient Instructions (Signed)
Please continue using your CPAP regularly. While your insurance requires that you use CPAP at least 4 hours each night on 70% of the nights, I recommend, that you not skip any nights and use it throughout the night if you can. Getting used to CPAP does take time and patience and discipline. Untreated obstructive sleep apnea when it is moderate to severe can have an adverse impact on cardiovascular health and raise her risk for heart disease, arrhythmias, hypertension, congestive heart failure, stroke and diabetes. Untreated obstructive sleep apnea causes sleep disruption, nonrestorative sleep, and sleep deprivation. This can have an impact on your day to day functioning and cause daytime sleepiness and impairment of cognitive function, memory loss, mood disturbance, and problems focussing. Using CPAP regularly can improve these symptoms.  Keep up the good work! I will see you back in 12 months.

## 2013-08-22 ENCOUNTER — Encounter: Payer: Self-pay | Admitting: Neurology

## 2013-09-12 ENCOUNTER — Encounter: Payer: Self-pay | Admitting: Internal Medicine

## 2014-03-20 ENCOUNTER — Encounter: Payer: Self-pay | Admitting: Internal Medicine

## 2014-05-01 ENCOUNTER — Ambulatory Visit (AMBULATORY_SURGERY_CENTER): Payer: Self-pay | Admitting: *Deleted

## 2014-05-01 VITALS — Ht 71.0 in | Wt 293.0 lb

## 2014-05-01 DIAGNOSIS — Z86010 Personal history of colonic polyps: Secondary | ICD-10-CM

## 2014-05-01 MED ORDER — MOVIPREP 100 G PO SOLR: ORAL | Status: DC

## 2014-05-01 NOTE — Progress Notes (Signed)
Patient denies any allergies to eggs or soy. Patient denies any problems with anesthesia/sedation. Patient uses CPAP, denies any oxygen use at home and does not take any diet/weight loss medications. No email per pt.

## 2014-05-13 ENCOUNTER — Encounter: Payer: Self-pay | Admitting: Internal Medicine

## 2014-05-13 ENCOUNTER — Telehealth: Payer: Self-pay | Admitting: Internal Medicine

## 2014-05-13 DIAGNOSIS — Z86010 Personal history of colonic polyps: Secondary | ICD-10-CM

## 2014-05-13 MED ORDER — MOVIPREP 100 G PO SOLR: ORAL | Status: DC

## 2014-05-13 NOTE — Telephone Encounter (Signed)
error 

## 2014-05-13 NOTE — Telephone Encounter (Signed)
Spoke with patient. Confirmed pharmacy. Reordered Moviprep as directed. Pt aware.

## 2014-05-15 ENCOUNTER — Ambulatory Visit (AMBULATORY_SURGERY_CENTER): Payer: BC Managed Care – PPO | Admitting: Internal Medicine

## 2014-05-15 ENCOUNTER — Encounter: Payer: Self-pay | Admitting: Internal Medicine

## 2014-05-15 VITALS — BP 156/78 | HR 75 | Temp 98.6°F | Resp 27 | Ht 71.0 in | Wt 293.0 lb

## 2014-05-15 DIAGNOSIS — Z86010 Personal history of colonic polyps: Secondary | ICD-10-CM

## 2014-05-15 DIAGNOSIS — D122 Benign neoplasm of ascending colon: Secondary | ICD-10-CM

## 2014-05-15 MED ORDER — SODIUM CHLORIDE 0.9 % IV SOLN: 500.0000 mL | INTRAVENOUS | Status: DC

## 2014-05-15 NOTE — Progress Notes (Signed)
Called to room to assist during endoscopic procedure.  Patient ID and intended procedure confirmed with present staff. Received instructions for my participation in the procedure from the performing physician.  

## 2014-05-15 NOTE — Progress Notes (Signed)
Awake alert and oriented x3 pleased with MAC report to RN

## 2014-05-15 NOTE — Patient Instructions (Addendum)
YOU HAD AN ENDOSCOPIC PROCEDURE TODAY AT THE [facility]: Refer to the procedure report that was given to you for any specific questions about what was found during the examination.  If the procedure report does not answer your questions, please call your gastroenterologist to clarify.  If you requested that your care partner not be given the details of your procedure findings, then the procedure report has been included in a sealed envelope for you to review at your convenience later.  YOU SHOULD EXPECT: Some feelings of bloating in the abdomen. Passage of more gas than usual.  Walking can help get rid of the air that was put into your GI tract during the procedure and reduce the bloating. If you had a lower endoscopy (such as a colonoscopy or flexible sigmoidoscopy) you may notice spotting of blood in your stool or on the toilet paper. If you underwent a bowel prep for your procedure, then you may not have a normal bowel movement for a few days.  DIET: Your first meal following the procedure should be a light meal and then it is ok to progress to your normal diet.  A half-sandwich or bowl of soup is an example of a good first meal.  Heavy or fried foods are harder to digest and may make you feel nauseous or bloated.  Likewise meals heavy in dairy and vegetables can cause extra gas to form and this can also increase the bloating.  Drink plenty of fluids but you should avoid alcoholic beverages for 24 hours.  ACTIVITY: Your care partner should take you home directly after the procedure.  You should plan to take it easy, moving slowly for the rest of the day.  You can resume normal activity the day after the procedure however you should NOT DRIVE or use heavy machinery for 24 hours (because of the sedation medicines used during the test).    SYMPTOMS TO REPORT IMMEDIATELY: A gastroenterologist can be reached at any hour.  During normal business hours, 8:30 AM to 5:00 PM Monday through Friday,  call [phone].  After hours and on weekends, please call the GI answering service at [phone] who will take a message and have the physician on call contact you.   Following lower endoscopy (colonoscopy or flexible sigmoidoscopy):  Excessive amounts of blood in the stool  Significant tenderness or worsening of abdominal pains  Swelling of the abdomen that is new, acute  Fever of 100F or higher  FOLLOW UP: If any biopsies were taken you will be contacted by phone or by letter within the next 1-3 weeks.  Call your gastroenterologist if you have not heard about the biopsies in 3 weeks.  Our staff will call the home number listed on your records the next business day following your procedure to check on you and address any questions or concerns that you may have at that time regarding the information given to you following your procedure. This is a courtesy call and so if there is no answer at the home number and we have not heard from you through the emergency physician on call, we will assume that you have returned to your regular daily activities without incident.  SIGNATURES/CONFIDENTIALITY: You and/or your care partner have signed paperwork which will be entered into your electronic medical record.  These signatures attest to the fact that that the information above on your After Visit Summary has been reviewed and is understood.  Full responsibility of the confidentiality of this   discharge information lies with you and/or your care-partner.  Await pathology  Continue your normal medications  Please read over handout about polyps, diverticulosis and high fiber diets

## 2014-05-15 NOTE — Op Note (Signed)
[facility]  [address] [place], [id number]   COLONOSCOPY PROCEDURE REPORT  PATIENT: Darin Chaney, Darin Chaney  MR#: [id number] BIRTHDATE: [birth year] , 71  yrs. old GENDER: male ENDOSCOPIST: Eustace Quail, MD REFERRED [id number] Program Recall PROCEDURE DATE:  05/15/2014 PROCEDURE:   Colonoscopy with snare polypectomy x 2 First Screening Colonoscopy - Avg.  risk and is 50 yrs.  old or older - No.  Prior Negative Screening - Now for repeat screening. N/A  History of Adenoma - Now for follow-up colonoscopy & has been > or = to 3 yrs.  Yes hx of adenoma.  Has been 3 or more years since last colonoscopy.  Polyps Removed Today? Yes. ASA CLASS:   Class II INDICATIONS:surveillance colonoscopy based on a history of adenomatous colonic polyp(s).   Index 09-2010 - multiple and large (TAs ans SSP) MEDICATIONS: Monitored anesthesia care and Propofol 400 mg IV  DESCRIPTION OF PROCEDURE:   After the risks benefits and alternatives of the procedure were thoroughly explained, informed consent was obtained.  The digital rectal exam revealed no abnormalities of the rectum.   The LB [id number]  endoscope was introduced through the anus and advanced to the cecum, which was identified by both the appendix and ileocecal valve. No adverse events experienced.   The quality of the prep was excellent, using MoviPrep  The instrument was then slowly withdrawn as the colon was fully examined.  COLON FINDINGS: Two polyps ranging between 3-55mm in size were found in the ascending colon.  A polypectomy was performed with a cold snare.  The resection was complete, the polyp tissue was completely retrieved and sent to histology.   The examination was otherwise normal aside from moderate diverticulosis throughout the entire examined colon.  Retroflexed views revealed internal hemorrhoids. The time to cecum=2 minutes 38 seconds.  Withdrawal time=12 minutes 15 seconds.  The scope was  withdrawn and the procedure completed. COMPLICATIONS: There were no immediate complications.  ENDOSCOPIC IMPRESSION: 1.   Two polyps were found in the ascending colon; polypectomy was performed with a cold snare 2.   Moderate diverticulosis was noted throughout the entire examined colon  RECOMMENDATIONS: 1. Follow up colonoscopy in 5 years  eSigned:  Eustace Quail, MD 05/15/2014 11:32 AM   cc: Burnard Bunting, MD and The Patient

## 2014-05-15 NOTE — Progress Notes (Signed)
Pt states he feels like he bit his tongue during the procedure

## 2014-05-20 ENCOUNTER — Telehealth: Payer: Self-pay | Admitting: *Deleted

## 2014-05-20 NOTE — Telephone Encounter (Signed)
  Follow up Call-  Call back number 05/15/2014  Post procedure Call Back phone  # [phone]  Permission to leave phone message Yes     Patient questions:  Do you have a fever, pain , or abdominal swelling? No. Pain Score  0 *  Have you tolerated food without any problems? Yes.    Have you been able to return to your normal activities? Yes.    Do you have any questions about your discharge instructions: Diet   No. Medications  No. Follow up visit  No.  Do you have questions or concerns about your Care? No.  Actions: * If pain score is 4 or above: No action needed, pain <4.

## 2014-05-21 ENCOUNTER — Encounter: Payer: Self-pay | Admitting: Internal Medicine

## 2014-07-25 ENCOUNTER — Telehealth: Payer: Self-pay | Admitting: Neurology

## 2014-07-25 NOTE — Telephone Encounter (Signed)
Patient has scheduled and confirmed appointment time change on 08/15/14

## 2014-08-15 ENCOUNTER — Ambulatory Visit: Payer: BC Managed Care – PPO | Admitting: Neurology

## 2014-08-15 ENCOUNTER — Encounter: Payer: Self-pay | Admitting: Neurology

## 2014-08-15 ENCOUNTER — Ambulatory Visit (INDEPENDENT_AMBULATORY_CARE_PROVIDER_SITE_OTHER): Payer: BLUE CROSS/BLUE SHIELD | Admitting: Neurology

## 2014-08-15 VITALS — BP 145/86 | HR 73 | Temp 98.8°F | Resp 20 | Ht 72.0 in | Wt 288.0 lb

## 2014-08-15 DIAGNOSIS — Z720 Tobacco use: Secondary | ICD-10-CM | POA: Diagnosis not present

## 2014-08-15 DIAGNOSIS — Z9989 Dependence on other enabling machines and devices: Secondary | ICD-10-CM

## 2014-08-15 DIAGNOSIS — F17200 Nicotine dependence, unspecified, uncomplicated: Secondary | ICD-10-CM

## 2014-08-15 DIAGNOSIS — G4733 Obstructive sleep apnea (adult) (pediatric): Secondary | ICD-10-CM

## 2014-08-15 DIAGNOSIS — F458 Other somatoform disorders: Secondary | ICD-10-CM | POA: Diagnosis not present

## 2014-08-15 NOTE — Progress Notes (Signed)
Subjective:    Patient ID: Darin Chaney is a 59 y.o. male.  HPI     Interim history:   Darin Chaney is a very pleasant 59 year old right-handed gentleman with a history of hypertension, hyperlipidemia, osteoarthritis, peripheral neuropathy, diastolic dysfunction, obesity and OSA, who presents for followup consultation of his obstructive sleep apnea. He is unaccompanied today. I last saw him on 08/14/2013, at which time he reported doing very well with CPAP, using it nightly and he was very pleased. He had minimal RLS symptoms. He had not lost weight and he was still smoking. He was still driving a cement mixer truck.  Today, 08/15/2014: I reviewed his compliance data from 07/14/2014 through 08/12/2014 which is a total of 30 days during which time he was 100% compliant with an average usage of 7 hours and 53 minutes. Pressure at 15 cm with EPR of 3. Residual AHI low at 1.5 per hour and leak acceptable for the most part with the 95th percentile at 24.4 L/m.     Today, 08/15/2014: He reports doing well with CPAP. He just got new supplies. He is compliant with treatment. He cannot sleep without CPAP. He is still smoking unfortunately. He has not lost much in the way of weight. He knows he can do better. 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 He has been told he is borderline diabetic. Blood pressure values have been borderline as well. He grinds his teeth at night. He has used an over-the-counter bite guard. He has not seen his dentist lately. He needs an eye exam as well. He has readers.   Previously:  I saw him on 02/12/2013, at which time I reviewed his compliance data with him and congratulated him on his good compliance. He felt much better with CPAP and indicated very good results as well as good tolerance of the mask and pressure. We talked about his leg movements and RLS symptoms. I told him that his Lexapro may be in part to blame for his RLS and periodic leg movements. He has an umbilical  hernia and has not yet stopped smoking. His surgeon has told him that he has to lose weight and decrease his smoking before he can have surgery for his umbilical hernia. I reviewed compliance data from 02/14/2013 to 03/15/2013, which is a total of 30 days, during which time the patient used CPAP every day. The average usage for all days was 7 hours and 51 minutes. The percent used days greater than 4 hours was 100 %, indicating excellent compliance. The residual AHI was 1.5 per hour, indicating an appropriate treatment pressure of 15 cwp with EPR of 3.    I reviewed his compliance data from 07/13/2013 through 08/11/2013 which is a total of 30 days during which time he uses CPAP every night with 100% compliance. Average usage was 8 hours and 48 minutes. Residual AHI was 1.6 and a pressure of 15 with EPR of 3. His sleep was quite high. 95th percentile oblique was 73.8 L per minute.  I first met him on 11/30/2012, which time he reported a diagnosis of obstructive sleep apnea with his last sleep study over 10 years ago. He also reported that his CPAP machine had broken. He is a Production designer, theatre/television/film. I asked him to come back for a split-night sleep study and went over his test results with him in detail before. He had a split-night sleep study on 12/05/2012: baseline sleep efficiency was reduced at 74.6% with a latency  to sleep of 21 minutes. He had a elevated arousal index of 60.9 per hour. He had an increased percentage of stage I and 2 sleep and absence of slow-wave and REM sleep. He had a baseline AHI of 50.6 per hour. His baseline oxygen saturation was 92%, his nadir was 86%. He was titrated on CPAP from 5-15 cm of water with a residual AHI of 0 per hour at the final pressure. I reviewed his compliance from 12/18/2012 to 01/16/2013 (30 days), during which time he used it every day. His average usage for all days was 7 hours and 59 minutes and his percent used days greater than 4 hours was under percent  indicating excellent compliance. His residual AHI was 1.5 per hour indicating an appropriate CPAP pressure of 15 with EPR of 3.   He had severe PLMs before and after CPAP and endorsed some mild RLS Sx and his wife has reported that he kicks in his sleep.   I also reviewed his compliance data from 01/11/2013 through 02/11/2013 (32 days), during which time he used it every day except 2 days. Average usage for all days was 8 hours and 10 minutes. Percent used days greater than 4 hours was 94 percent, indicating excellent compliance. His residual AHI was 1.9 per hour, indicating an appropriate CPAP pressure of 15 cm with EPR of 3.    His Past Medical History Is Significant For: Past Medical History  Diagnosis Date  . Hypertension   . Obesity   . Osteoarthritis   . IGT (impaired glucose tolerance)   . Peripheral neuropathy   . DVT (deep venous thrombosis)   . Anxiety   . Hyperlipidemia   . Obesity, unspecified 08/14/2013  . Sleep apnea     on CPAP    His Past Surgical History Is Significant For: Past Surgical History  Procedure Laterality Date  . Shoulder surgery      Rt.  . Surgery from accident  1992,JUNE 1ST    from fall:metal placed, hips,ankle,pelvis, bladder.  . Tonsillectomy      His Family History Is Significant For: Family History  Problem Relation Age of Onset  . Heart disease Father   . Diabetes Maternal Grandmother   . Heart disease Paternal Grandfather   . Colon cancer Neg Hx     His Social History Is Significant For: History   Social History  . Marital Status: Married    Spouse Name: N/A  . Number of Children: 2  . Years of Education: N/A   Occupational History  . [employer]   .     Social History Main Topics  . Smoking status: Current Every Day Smoker -- 1.00 packs/day    Types: Cigarettes  . Smokeless tobacco: Never Used  . Alcohol Use: No  . Drug Use: No  . Sexual Activity: Not on file   Other Topics Concern  . None   Social History  Narrative   Patient lives at home with his wife Darin Chaney and has 3 children.Patient  has a high school education and works at [employer]. Patient smokes 2 packs of cigarettes  a day and drinks caffinted drinks. Patient denies drinking alcohol and taking all illicit drugs.    His Allergies Are:  Allergies  Allergen Reactions  . Penicillins Nausea Only and Rash  :   His Current Medications Are:  Outpatient Encounter Prescriptions as of 08/15/2014  Medication Sig  . diltiazem (DILACOR XR) 120 MG 24 hr capsule   .  escitalopram (LEXAPRO) 20 MG tablet Take 30 mg by mouth daily.   Marland Kitchen ibuprofen (ADVIL,MOTRIN) 200 MG tablet Take 200 mg by mouth every 6 (six) hours as needed.    . Multiple Vitamins-Minerals (CENTRUM ULTRA MENS PO) Take 1 tablet by mouth daily.  . pravastatin (PRAVACHOL) 40 MG tablet Take 40 mg by mouth daily.   Marland Kitchen zolpidem (AMBIEN) 10 MG tablet Take 10 mg by mouth at bedtime as needed.    :  Review of Systems:  Out of a complete 14 point review of systems, all are reviewed and negative with the exception of these symptoms as listed below:   Review of Systems  Neurological:       Grinding teeth during day and night    Objective:  Neurologic Exam  Physical Exam Physical Examination:   Filed Vitals:   08/15/14 0932  BP: 145/86  Pulse: 73  Temp: 98.8 F (37.1 C)  Resp: 20   General Examination: The patient is a very pleasant 59 y.o. male in no acute distress. He appears well-developed and well-nourished and adequately groomed. He is obese.  HEENT: Normocephalic, atraumatic, pupils are equal, round and reactive to light and accommodation. Funduscopic exam is unremarkable. Extraocular tracking is good without limitation to gaze excursion or nystagmus noted. Normal smooth pursuit is noted. Hearing is grossly intact. Face is symmetric with normal facial animation and normal facial sensation. Speech is clear with no dysarthria noted. There is no hypophonia. There is  no lip, neck/head, jaw or voice tremor. Neck is supple with full range of passive and active motion. There are no carotid bruits on auscultation. Airway exam reveals mild mouth dryness, slight pharyngeal erythema, significantly crowded airway.  Chest: Clear to auscultation without wheezing, rhonchi or crackles noted.  Heart: S1+S2+0, regular and normal without murmurs, rubs or gallops noted.   Abdomen: Soft, non-tender and non-distended with normal bowel sounds appreciated on auscultation.  Extremities: There is no pitting edema in the distal lower extremities bilaterally. Pedal pulses are intact. Ankles were fused.  Skin: Warm and dry without trophic changes noted. There are no varicose veins. He has some old-appearing bruises on his forearms and dorsum of his hands.  Musculoskeletal: exam reveals no obvious joint deformities, tenderness or joint swelling or erythema.   Neurologically:  Mental status: The patient is awake, alert and oriented in all 4 spheres. His memory, attention, language and knowledge are appropriate. There is no aphasia, agnosia, apraxia or anomia. Speech is clear with normal prosody and enunciation. Thought process is linear. Mood is congruent and affect is normal.  Cranial nerves are as described above under HEENT exam. In addition, shoulder shrug is normal with equal shoulder height noted. Motor exam: Normal bulk, strength and tone is noted. There is no drift, tremor or rebound. Reflexes are 1+ throughout with absent ankle jerks. Fine motor skills are intact with finger to nose testing, rapid alternating movements, finger taps, and heel-to-shin is bilaterally unremarkable. Romberg is negative.  Cerebellar testing shows no dysmetria or intention tremor. There is no truncal or gait ataxia.  Sensory exam is intact to light touch in the upper and lower extremities. Gait, station and balance are unremarkable. Tandem walk is normal.    Assessment and Plan:   In summary,  Darin Chaney is a very pleasant 59 year old male with an underlying medical history of hypertension, hyperlipidemia, osteoarthritis, peripheral neuropathy, diastolic dysfunction, smoking and obesity, who presents for followup consultation of his severe obstructive sleep apnea, on treatment with  CPAP of 15 cm with EPR of 3. He indicates ongoing great results with CPAP and is fully compliant with treatment. I congratulated him on his great treatment adherence. He has lost a few pounds since last year for me. He is encouraged to work harder on weight loss. He is encouraged to drink more water. He is advised to stop smoking. Leak has been borderline. I renewed his CPAP supply water. I asked him to schedule an appointment with his dentist to talk about a custom-made bite guard for his tooth grinding.    We also talked about trying to maintaining a healthy lifestyle in general. I encouraged the patient to eat healthy, exercise daily and keep well hydrated, to keep a scheduled bedtime and wake time routine, to not skip any meals and eat healthy snacks in between meals and to have protein with every meal. I stressed the importance of regular exercise. I can see him back in a year.   I answered all his questions today and the patient was in agreement with the above outlined plan. I encouraged him to call with any interim questions, concerns, problems or updates.

## 2014-08-15 NOTE — Patient Instructions (Addendum)
Please continue using your CPAP regularly. While your insurance requires that you use CPAP at least 4 hours each night on 70% of the nights, I recommend, that you not skip any nights and use it throughout the night if you can. Getting used to CPAP and staying with the treatment long term does take time and patience and discipline. Untreated obstructive sleep apnea when it is moderate to severe can have an adverse impact on cardiovascular health and raise her risk for heart disease, arrhythmias, hypertension, congestive heart failure, stroke and diabetes. Untreated obstructive sleep apnea causes sleep disruption, nonrestorative sleep, and sleep deprivation. This can have an impact on your day to day functioning and cause daytime sleepiness and impairment of cognitive function, memory loss, mood disturbance, and problems focussing. Using CPAP regularly can improve these symptoms.  Keep up the good work! I will see you back in 12 months for sleep apnea check up.   Please continue to try to lose weight. Stop smoking.

## 2014-08-19 ENCOUNTER — Encounter: Payer: Self-pay | Admitting: Neurology

## 2014-09-18 ENCOUNTER — Encounter: Payer: Self-pay | Admitting: Neurology

## 2015-05-30 ENCOUNTER — Encounter: Payer: Self-pay | Admitting: Neurology

## 2015-08-01 ENCOUNTER — Ambulatory Visit: Payer: BLUE CROSS/BLUE SHIELD | Admitting: Neurology

## 2015-12-04 DIAGNOSIS — E038 Other specified hypothyroidism: Secondary | ICD-10-CM | POA: Diagnosis not present

## 2015-12-04 DIAGNOSIS — E1149 Type 2 diabetes mellitus with other diabetic neurological complication: Secondary | ICD-10-CM | POA: Diagnosis not present

## 2015-12-04 DIAGNOSIS — E784 Other hyperlipidemia: Secondary | ICD-10-CM | POA: Diagnosis not present

## 2015-12-04 DIAGNOSIS — I10 Essential (primary) hypertension: Secondary | ICD-10-CM | POA: Diagnosis not present

## 2016-03-31 DIAGNOSIS — E1149 Type 2 diabetes mellitus with other diabetic neurological complication: Secondary | ICD-10-CM | POA: Diagnosis not present

## 2016-03-31 DIAGNOSIS — E038 Other specified hypothyroidism: Secondary | ICD-10-CM | POA: Diagnosis not present

## 2016-03-31 DIAGNOSIS — R8290 Unspecified abnormal findings in urine: Secondary | ICD-10-CM | POA: Diagnosis not present

## 2016-03-31 DIAGNOSIS — Z0000 Encounter for general adult medical examination without abnormal findings: Secondary | ICD-10-CM | POA: Diagnosis not present

## 2016-03-31 DIAGNOSIS — Z125 Encounter for screening for malignant neoplasm of prostate: Secondary | ICD-10-CM | POA: Diagnosis not present

## 2016-03-31 DIAGNOSIS — E784 Other hyperlipidemia: Secondary | ICD-10-CM | POA: Diagnosis not present

## 2016-04-09 DIAGNOSIS — Z0000 Encounter for general adult medical examination without abnormal findings: Secondary | ICD-10-CM | POA: Diagnosis not present

## 2016-04-09 DIAGNOSIS — Z720 Tobacco use: Secondary | ICD-10-CM | POA: Diagnosis not present

## 2016-04-09 DIAGNOSIS — E038 Other specified hypothyroidism: Secondary | ICD-10-CM | POA: Diagnosis not present

## 2016-04-09 DIAGNOSIS — I10 Essential (primary) hypertension: Secondary | ICD-10-CM | POA: Diagnosis not present

## 2016-04-13 DIAGNOSIS — Z1212 Encounter for screening for malignant neoplasm of rectum: Secondary | ICD-10-CM | POA: Diagnosis not present

## 2016-09-20 DIAGNOSIS — M170 Bilateral primary osteoarthritis of knee: Secondary | ICD-10-CM | POA: Diagnosis not present

## 2016-10-22 DIAGNOSIS — M19172 Post-traumatic osteoarthritis, left ankle and foot: Secondary | ICD-10-CM | POA: Diagnosis not present

## 2016-10-22 DIAGNOSIS — M25571 Pain in right ankle and joints of right foot: Secondary | ICD-10-CM | POA: Diagnosis not present

## 2016-11-17 DIAGNOSIS — Z1389 Encounter for screening for other disorder: Secondary | ICD-10-CM | POA: Diagnosis not present

## 2016-11-17 DIAGNOSIS — E038 Other specified hypothyroidism: Secondary | ICD-10-CM | POA: Diagnosis not present

## 2016-11-17 DIAGNOSIS — I10 Essential (primary) hypertension: Secondary | ICD-10-CM | POA: Diagnosis not present

## 2016-11-17 DIAGNOSIS — E1149 Type 2 diabetes mellitus with other diabetic neurological complication: Secondary | ICD-10-CM | POA: Diagnosis not present

## 2016-11-17 DIAGNOSIS — M1990 Unspecified osteoarthritis, unspecified site: Secondary | ICD-10-CM | POA: Diagnosis not present

## 2017-04-21 DIAGNOSIS — I10 Essential (primary) hypertension: Secondary | ICD-10-CM | POA: Diagnosis not present

## 2017-04-21 DIAGNOSIS — R8290 Unspecified abnormal findings in urine: Secondary | ICD-10-CM | POA: Diagnosis not present

## 2017-04-21 DIAGNOSIS — E7849 Other hyperlipidemia: Secondary | ICD-10-CM | POA: Diagnosis not present

## 2017-04-21 DIAGNOSIS — E1149 Type 2 diabetes mellitus with other diabetic neurological complication: Secondary | ICD-10-CM | POA: Diagnosis not present

## 2017-04-21 DIAGNOSIS — Z0000 Encounter for general adult medical examination without abnormal findings: Secondary | ICD-10-CM | POA: Diagnosis not present

## 2017-04-21 DIAGNOSIS — Z125 Encounter for screening for malignant neoplasm of prostate: Secondary | ICD-10-CM | POA: Diagnosis not present

## 2017-04-21 DIAGNOSIS — E038 Other specified hypothyroidism: Secondary | ICD-10-CM | POA: Diagnosis not present

## 2017-04-27 DIAGNOSIS — Z720 Tobacco use: Secondary | ICD-10-CM | POA: Diagnosis not present

## 2017-04-27 DIAGNOSIS — I10 Essential (primary) hypertension: Secondary | ICD-10-CM | POA: Diagnosis not present

## 2017-04-27 DIAGNOSIS — Z0000 Encounter for general adult medical examination without abnormal findings: Secondary | ICD-10-CM | POA: Diagnosis not present

## 2017-04-27 DIAGNOSIS — E669 Obesity, unspecified: Secondary | ICD-10-CM | POA: Diagnosis not present

## 2017-04-27 DIAGNOSIS — E038 Other specified hypothyroidism: Secondary | ICD-10-CM | POA: Diagnosis not present

## 2017-04-29 DIAGNOSIS — Z1212 Encounter for screening for malignant neoplasm of rectum: Secondary | ICD-10-CM | POA: Diagnosis not present

## 2017-06-13 DIAGNOSIS — M1712 Unilateral primary osteoarthritis, left knee: Secondary | ICD-10-CM | POA: Diagnosis not present

## 2017-06-13 DIAGNOSIS — M1711 Unilateral primary osteoarthritis, right knee: Secondary | ICD-10-CM | POA: Diagnosis not present

## 2017-09-12 DIAGNOSIS — Z720 Tobacco use: Secondary | ICD-10-CM | POA: Diagnosis not present

## 2017-09-12 DIAGNOSIS — E038 Other specified hypothyroidism: Secondary | ICD-10-CM | POA: Diagnosis not present

## 2017-09-12 DIAGNOSIS — E669 Obesity, unspecified: Secondary | ICD-10-CM | POA: Diagnosis not present

## 2017-09-12 DIAGNOSIS — E1149 Type 2 diabetes mellitus with other diabetic neurological complication: Secondary | ICD-10-CM | POA: Diagnosis not present

## 2018-02-08 DIAGNOSIS — E1149 Type 2 diabetes mellitus with other diabetic neurological complication: Secondary | ICD-10-CM | POA: Diagnosis not present

## 2018-02-08 DIAGNOSIS — E7849 Other hyperlipidemia: Secondary | ICD-10-CM | POA: Diagnosis not present

## 2018-02-08 DIAGNOSIS — I5189 Other ill-defined heart diseases: Secondary | ICD-10-CM | POA: Diagnosis not present

## 2018-02-08 DIAGNOSIS — I10 Essential (primary) hypertension: Secondary | ICD-10-CM | POA: Diagnosis not present

## 2018-06-07 DIAGNOSIS — E038 Other specified hypothyroidism: Secondary | ICD-10-CM | POA: Diagnosis not present

## 2018-06-07 DIAGNOSIS — Z0000 Encounter for general adult medical examination without abnormal findings: Secondary | ICD-10-CM | POA: Diagnosis not present

## 2018-06-07 DIAGNOSIS — R82998 Other abnormal findings in urine: Secondary | ICD-10-CM | POA: Diagnosis not present

## 2018-06-07 DIAGNOSIS — E1149 Type 2 diabetes mellitus with other diabetic neurological complication: Secondary | ICD-10-CM | POA: Diagnosis not present

## 2018-06-07 DIAGNOSIS — Z125 Encounter for screening for malignant neoplasm of prostate: Secondary | ICD-10-CM | POA: Diagnosis not present

## 2018-06-14 DIAGNOSIS — I10 Essential (primary) hypertension: Secondary | ICD-10-CM | POA: Diagnosis not present

## 2018-06-14 DIAGNOSIS — Z0000 Encounter for general adult medical examination without abnormal findings: Secondary | ICD-10-CM | POA: Diagnosis not present

## 2018-06-14 DIAGNOSIS — I5189 Other ill-defined heart diseases: Secondary | ICD-10-CM | POA: Diagnosis not present

## 2018-06-14 DIAGNOSIS — E7849 Other hyperlipidemia: Secondary | ICD-10-CM | POA: Diagnosis not present

## 2018-06-14 DIAGNOSIS — E1149 Type 2 diabetes mellitus with other diabetic neurological complication: Secondary | ICD-10-CM | POA: Diagnosis not present

## 2018-06-14 DIAGNOSIS — Z1331 Encounter for screening for depression: Secondary | ICD-10-CM | POA: Diagnosis not present

## 2018-06-16 DIAGNOSIS — Z1212 Encounter for screening for malignant neoplasm of rectum: Secondary | ICD-10-CM | POA: Diagnosis not present

## 2018-09-12 DIAGNOSIS — G4733 Obstructive sleep apnea (adult) (pediatric): Secondary | ICD-10-CM | POA: Diagnosis not present

## 2018-09-25 DIAGNOSIS — M170 Bilateral primary osteoarthritis of knee: Secondary | ICD-10-CM | POA: Diagnosis not present

## 2018-09-25 DIAGNOSIS — M1711 Unilateral primary osteoarthritis, right knee: Secondary | ICD-10-CM | POA: Diagnosis not present

## 2018-09-25 DIAGNOSIS — M1712 Unilateral primary osteoarthritis, left knee: Secondary | ICD-10-CM | POA: Diagnosis not present

## 2018-10-25 DIAGNOSIS — E785 Hyperlipidemia, unspecified: Secondary | ICD-10-CM | POA: Diagnosis not present

## 2018-10-25 DIAGNOSIS — E1149 Type 2 diabetes mellitus with other diabetic neurological complication: Secondary | ICD-10-CM | POA: Diagnosis not present

## 2018-10-25 DIAGNOSIS — I5189 Other ill-defined heart diseases: Secondary | ICD-10-CM | POA: Diagnosis not present

## 2018-10-25 DIAGNOSIS — I10 Essential (primary) hypertension: Secondary | ICD-10-CM | POA: Diagnosis not present

## 2018-11-23 DIAGNOSIS — G4733 Obstructive sleep apnea (adult) (pediatric): Secondary | ICD-10-CM | POA: Diagnosis not present

## 2019-01-02 DIAGNOSIS — G4733 Obstructive sleep apnea (adult) (pediatric): Secondary | ICD-10-CM | POA: Diagnosis not present

## 2019-02-02 DIAGNOSIS — G4733 Obstructive sleep apnea (adult) (pediatric): Secondary | ICD-10-CM | POA: Diagnosis not present

## 2019-03-04 DIAGNOSIS — G4733 Obstructive sleep apnea (adult) (pediatric): Secondary | ICD-10-CM | POA: Diagnosis not present

## 2019-04-04 DIAGNOSIS — G4733 Obstructive sleep apnea (adult) (pediatric): Secondary | ICD-10-CM | POA: Diagnosis not present

## 2019-04-11 DIAGNOSIS — I5189 Other ill-defined heart diseases: Secondary | ICD-10-CM | POA: Diagnosis not present

## 2019-04-11 DIAGNOSIS — E1149 Type 2 diabetes mellitus with other diabetic neurological complication: Secondary | ICD-10-CM | POA: Diagnosis not present

## 2019-04-11 DIAGNOSIS — I10 Essential (primary) hypertension: Secondary | ICD-10-CM | POA: Diagnosis not present

## 2019-04-11 DIAGNOSIS — E785 Hyperlipidemia, unspecified: Secondary | ICD-10-CM | POA: Diagnosis not present

## 2019-04-25 ENCOUNTER — Encounter: Payer: Self-pay | Admitting: Internal Medicine

## 2019-05-31 DIAGNOSIS — G4733 Obstructive sleep apnea (adult) (pediatric): Secondary | ICD-10-CM | POA: Diagnosis not present

## 2019-07-11 DIAGNOSIS — Z125 Encounter for screening for malignant neoplasm of prostate: Secondary | ICD-10-CM | POA: Diagnosis not present

## 2019-07-11 DIAGNOSIS — E039 Hypothyroidism, unspecified: Secondary | ICD-10-CM | POA: Diagnosis not present

## 2019-07-11 DIAGNOSIS — E1149 Type 2 diabetes mellitus with other diabetic neurological complication: Secondary | ICD-10-CM | POA: Diagnosis not present

## 2019-07-11 DIAGNOSIS — Z0000 Encounter for general adult medical examination without abnormal findings: Secondary | ICD-10-CM | POA: Diagnosis not present

## 2019-07-11 DIAGNOSIS — E7849 Other hyperlipidemia: Secondary | ICD-10-CM | POA: Diagnosis not present

## 2019-07-16 DIAGNOSIS — R82998 Other abnormal findings in urine: Secondary | ICD-10-CM | POA: Diagnosis not present

## 2019-07-16 DIAGNOSIS — I10 Essential (primary) hypertension: Secondary | ICD-10-CM | POA: Diagnosis not present

## 2019-07-18 DIAGNOSIS — Z1212 Encounter for screening for malignant neoplasm of rectum: Secondary | ICD-10-CM | POA: Diagnosis not present

## 2019-07-18 DIAGNOSIS — I10 Essential (primary) hypertension: Secondary | ICD-10-CM | POA: Diagnosis not present

## 2019-07-18 DIAGNOSIS — Z0000 Encounter for general adult medical examination without abnormal findings: Secondary | ICD-10-CM | POA: Diagnosis not present

## 2019-07-18 DIAGNOSIS — Z1331 Encounter for screening for depression: Secondary | ICD-10-CM | POA: Diagnosis not present

## 2019-07-18 DIAGNOSIS — E1149 Type 2 diabetes mellitus with other diabetic neurological complication: Secondary | ICD-10-CM | POA: Diagnosis not present

## 2019-07-18 DIAGNOSIS — L989 Disorder of the skin and subcutaneous tissue, unspecified: Secondary | ICD-10-CM | POA: Diagnosis not present

## 2019-07-18 DIAGNOSIS — E785 Hyperlipidemia, unspecified: Secondary | ICD-10-CM | POA: Diagnosis not present

## 2019-07-31 DIAGNOSIS — C44622 Squamous cell carcinoma of skin of right upper limb, including shoulder: Secondary | ICD-10-CM | POA: Diagnosis not present

## 2019-07-31 DIAGNOSIS — L570 Actinic keratosis: Secondary | ICD-10-CM | POA: Diagnosis not present

## 2019-07-31 DIAGNOSIS — D0472 Carcinoma in situ of skin of left lower limb, including hip: Secondary | ICD-10-CM | POA: Diagnosis not present

## 2019-08-20 DIAGNOSIS — C44622 Squamous cell carcinoma of skin of right upper limb, including shoulder: Secondary | ICD-10-CM | POA: Diagnosis not present

## 2019-08-29 DIAGNOSIS — G4733 Obstructive sleep apnea (adult) (pediatric): Secondary | ICD-10-CM | POA: Diagnosis not present

## 2019-09-03 DIAGNOSIS — L988 Other specified disorders of the skin and subcutaneous tissue: Secondary | ICD-10-CM | POA: Diagnosis not present

## 2019-09-03 DIAGNOSIS — L570 Actinic keratosis: Secondary | ICD-10-CM | POA: Diagnosis not present

## 2019-09-03 DIAGNOSIS — D0472 Carcinoma in situ of skin of left lower limb, including hip: Secondary | ICD-10-CM | POA: Diagnosis not present

## 2019-09-11 DIAGNOSIS — R350 Frequency of micturition: Secondary | ICD-10-CM | POA: Diagnosis not present

## 2019-09-11 DIAGNOSIS — N430 Encysted hydrocele: Secondary | ICD-10-CM | POA: Diagnosis not present

## 2019-09-22 HISTORY — PX: OTHER SURGICAL HISTORY: SHX169

## 2019-11-22 DIAGNOSIS — I10 Essential (primary) hypertension: Secondary | ICD-10-CM | POA: Diagnosis not present

## 2019-11-22 DIAGNOSIS — E1149 Type 2 diabetes mellitus with other diabetic neurological complication: Secondary | ICD-10-CM | POA: Diagnosis not present

## 2019-11-22 DIAGNOSIS — I5189 Other ill-defined heart diseases: Secondary | ICD-10-CM | POA: Diagnosis not present

## 2019-11-22 DIAGNOSIS — G629 Polyneuropathy, unspecified: Secondary | ICD-10-CM | POA: Diagnosis not present

## 2019-11-27 DIAGNOSIS — G4733 Obstructive sleep apnea (adult) (pediatric): Secondary | ICD-10-CM | POA: Diagnosis not present

## 2019-12-10 DIAGNOSIS — R350 Frequency of micturition: Secondary | ICD-10-CM | POA: Diagnosis not present

## 2019-12-10 DIAGNOSIS — N430 Encysted hydrocele: Secondary | ICD-10-CM | POA: Diagnosis not present

## 2019-12-13 DIAGNOSIS — R002 Palpitations: Secondary | ICD-10-CM | POA: Diagnosis not present

## 2019-12-13 DIAGNOSIS — Z20828 Contact with and (suspected) exposure to other viral communicable diseases: Secondary | ICD-10-CM | POA: Diagnosis not present

## 2019-12-13 DIAGNOSIS — R509 Fever, unspecified: Secondary | ICD-10-CM | POA: Diagnosis not present

## 2019-12-13 DIAGNOSIS — Z1152 Encounter for screening for COVID-19: Secondary | ICD-10-CM | POA: Diagnosis not present

## 2019-12-13 DIAGNOSIS — I10 Essential (primary) hypertension: Secondary | ICD-10-CM | POA: Diagnosis not present

## 2019-12-13 DIAGNOSIS — F329 Major depressive disorder, single episode, unspecified: Secondary | ICD-10-CM | POA: Diagnosis not present

## 2019-12-17 ENCOUNTER — Other Ambulatory Visit: Payer: Self-pay | Admitting: Urology

## 2019-12-19 ENCOUNTER — Encounter (HOSPITAL_BASED_OUTPATIENT_CLINIC_OR_DEPARTMENT_OTHER): Payer: Self-pay | Admitting: Urology

## 2019-12-19 ENCOUNTER — Other Ambulatory Visit: Payer: Self-pay

## 2019-12-19 NOTE — Progress Notes (Signed)
Spoke w/ via phone for pre-op interview---Pt Lab needs dos----      I stat 8, ekg          Lab results------sleep study [id number] epic COVID test ------12-21-2019 at 1455 Arrive at -------650 am 12-25-2019 NPO after MN NO Solid Food.  Clear liquids from MN until---550 am then npo Medications to take morning of surgery -----escitalopram, pravastatin, diltiazem, wellbutrin Diabetic medication -----none day of surgery Patient Special Instructions -----bring cpap mask tubing and machine and leave in car Pre-Op special Istructions -----none Patient verbalized understanding of instructions that were given at this phone interview. Patient denies shortness of breath, chest pain, fever, cough a this phone interview.

## 2019-12-21 ENCOUNTER — Other Ambulatory Visit (HOSPITAL_COMMUNITY)
Admission: RE | Admit: 2019-12-21 | Discharge: 2019-12-21 | Disposition: A | Discharge status: Home / Self Care | Payer: BC Managed Care – PPO | Source: Ambulatory Visit | Attending: Urology | Admitting: Urology

## 2019-12-21 DIAGNOSIS — Z01812 Encounter for preprocedural laboratory examination: Secondary | ICD-10-CM | POA: Diagnosis not present

## 2019-12-21 DIAGNOSIS — Z20822 Contact with and (suspected) exposure to covid-19: Secondary | ICD-10-CM | POA: Diagnosis not present

## 2019-12-21 LAB — SARS CORONAVIRUS 2 (TAT 6-24 HRS): SARS Coronavirus 2: NEGATIVE
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
## 2019-12-25 ENCOUNTER — Ambulatory Visit (HOSPITAL_BASED_OUTPATIENT_CLINIC_OR_DEPARTMENT_OTHER)
Admission: RE | Admit: 2019-12-25 | Discharge: 2019-12-25 | Disposition: A | Discharge status: Home / Self Care | Payer: BC Managed Care – PPO | Attending: Urology | Admitting: Urology

## 2019-12-25 ENCOUNTER — Ambulatory Visit (HOSPITAL_BASED_OUTPATIENT_CLINIC_OR_DEPARTMENT_OTHER): Payer: BC Managed Care – PPO | Admitting: Anesthesiology

## 2019-12-25 ENCOUNTER — Encounter (HOSPITAL_BASED_OUTPATIENT_CLINIC_OR_DEPARTMENT_OTHER): Payer: Self-pay | Admitting: Urology

## 2019-12-25 ENCOUNTER — Other Ambulatory Visit: Payer: Self-pay

## 2019-12-25 ENCOUNTER — Encounter (HOSPITAL_BASED_OUTPATIENT_CLINIC_OR_DEPARTMENT_OTHER)
Admission: RE | Disposition: A | Discharge status: Home / Self Care | Payer: Self-pay | Source: Home / Self Care | Attending: Urology

## 2019-12-25 DIAGNOSIS — E7800 Pure hypercholesterolemia, unspecified: Secondary | ICD-10-CM | POA: Insufficient documentation

## 2019-12-25 DIAGNOSIS — E119 Type 2 diabetes mellitus without complications: Secondary | ICD-10-CM | POA: Diagnosis not present

## 2019-12-25 DIAGNOSIS — I10 Essential (primary) hypertension: Secondary | ICD-10-CM | POA: Insufficient documentation

## 2019-12-25 DIAGNOSIS — G4730 Sleep apnea, unspecified: Secondary | ICD-10-CM | POA: Insufficient documentation

## 2019-12-25 DIAGNOSIS — G4733 Obstructive sleep apnea (adult) (pediatric): Secondary | ICD-10-CM | POA: Diagnosis not present

## 2019-12-25 DIAGNOSIS — Z7984 Long term (current) use of oral hypoglycemic drugs: Secondary | ICD-10-CM | POA: Diagnosis not present

## 2019-12-25 DIAGNOSIS — Z880 Allergy status to penicillin: Secondary | ICD-10-CM | POA: Diagnosis not present

## 2019-12-25 DIAGNOSIS — F17210 Nicotine dependence, cigarettes, uncomplicated: Secondary | ICD-10-CM | POA: Insufficient documentation

## 2019-12-25 DIAGNOSIS — Z79899 Other long term (current) drug therapy: Secondary | ICD-10-CM | POA: Insufficient documentation

## 2019-12-25 DIAGNOSIS — N430 Encysted hydrocele: Secondary | ICD-10-CM | POA: Diagnosis not present

## 2019-12-25 DIAGNOSIS — Z79890 Hormone replacement therapy (postmenopausal): Secondary | ICD-10-CM | POA: Diagnosis not present

## 2019-12-25 DIAGNOSIS — Z6838 Body mass index (BMI) 38.0-38.9, adult: Secondary | ICD-10-CM | POA: Insufficient documentation

## 2019-12-25 DIAGNOSIS — F329 Major depressive disorder, single episode, unspecified: Secondary | ICD-10-CM | POA: Diagnosis not present

## 2019-12-25 DIAGNOSIS — N4883 Acquired buried penis: Secondary | ICD-10-CM | POA: Diagnosis not present

## 2019-12-25 DIAGNOSIS — N433 Hydrocele, unspecified: Secondary | ICD-10-CM | POA: Diagnosis not present

## 2019-12-25 DIAGNOSIS — N4340 Spermatocele of epididymis, unspecified: Secondary | ICD-10-CM | POA: Diagnosis not present

## 2019-12-25 DIAGNOSIS — N4341 Spermatocele of epididymis, single: Secondary | ICD-10-CM | POA: Diagnosis not present

## 2019-12-25 HISTORY — PX: SPERMATOCELECTOMY: SHX2420

## 2019-12-25 HISTORY — DX: Hydrocele, unspecified: N43.3

## 2019-12-25 HISTORY — DX: Type 2 diabetes mellitus without complications: E11.9

## 2019-12-25 LAB — POCT I-STAT, CHEM 8
BUN: 25 mg/dL — ABNORMAL HIGH (ref 8–23)
Calcium, Ion: 1.24 mmol/L (ref 1.15–1.40)
Chloride: 102 mmol/L (ref 98–111)
Creatinine, Ser: 1.1 mg/dL (ref 0.61–1.24)
Glucose, Bld: 200 mg/dL — ABNORMAL HIGH (ref 70–99)
HCT: 47 % (ref 39.0–52.0)
Hemoglobin: 16 g/dL (ref 13.0–17.0)
Potassium: 4.7 mmol/L (ref 3.5–5.1)
Sodium: 139 mmol/L (ref 135–145)
TCO2: 25 mmol/L (ref 22–32)

## 2019-12-25 LAB — GLUCOSE, CAPILLARY: Glucose-Capillary: 176 mg/dL — ABNORMAL HIGH (ref 70–99)

## 2019-12-25 SURGERY — EXCISION, SPERMATOCELE
Anesthesia: General | Laterality: Right

## 2019-12-25 MED ORDER — LIDOCAINE-EPINEPHRINE (PF) 1 %-1:200000 IJ SOLN
INTRAMUSCULAR | Status: DC | PRN
  Administered 2019-12-25: 2.5 mL

## 2019-12-25 MED ORDER — LIDOCAINE 2% (20 MG/ML) 5 ML SYRINGE
INTRAMUSCULAR | Status: AC
  Filled 2019-12-25: qty 5

## 2019-12-25 MED ORDER — FENTANYL CITRATE (PF) 100 MCG/2ML IJ SOLN
INTRAMUSCULAR | Status: AC
  Filled 2019-12-25: qty 2

## 2019-12-25 MED ORDER — DEXAMETHASONE SODIUM PHOSPHATE 10 MG/ML IJ SOLN
INTRAMUSCULAR | Status: AC
  Filled 2019-12-25: qty 1

## 2019-12-25 MED ORDER — PROPOFOL 10 MG/ML IV BOLUS
INTRAVENOUS | Status: AC
  Filled 2019-12-25: qty 20

## 2019-12-25 MED ORDER — MIDAZOLAM HCL 5 MG/5ML IJ SOLN
INTRAMUSCULAR | Status: DC | PRN
  Administered 2019-12-25: 2 mg via INTRAVENOUS

## 2019-12-25 MED ORDER — DEXAMETHASONE SODIUM PHOSPHATE 10 MG/ML IJ SOLN
INTRAMUSCULAR | Status: DC | PRN
  Administered 2019-12-25: 10 mg via INTRAVENOUS

## 2019-12-25 MED ORDER — CEFAZOLIN SODIUM-DEXTROSE 2-4 GM/100ML-% IV SOLN
2.0000 g | Freq: Once | INTRAVENOUS | Status: AC
  Administered 2019-12-25: 2 g via INTRAVENOUS

## 2019-12-25 MED ORDER — OXYCODONE HCL 5 MG PO TABS
5.0000 mg | ORAL_TABLET | ORAL | Status: DC | PRN
  Administered 2019-12-25: 5 mg via ORAL

## 2019-12-25 MED ORDER — BUPIVACAINE-EPINEPHRINE 0.5% -1:200000 IJ SOLN
INTRAMUSCULAR | Status: DC | PRN
  Administered 2019-12-25: 2.5 mL

## 2019-12-25 MED ORDER — IBUPROFEN 200 MG PO TABS: 200.0000 mg | ORAL_TABLET | Freq: Four times a day (QID) | ORAL | 0 refills | Status: AC | PRN

## 2019-12-25 MED ORDER — ONDANSETRON HCL 4 MG/2ML IJ SOLN: 4.0000 mg | Freq: Once | INTRAMUSCULAR | Status: DC | PRN

## 2019-12-25 MED ORDER — ONDANSETRON HCL 4 MG/2ML IJ SOLN
INTRAMUSCULAR | Status: DC | PRN
  Administered 2019-12-25: 4 mg via INTRAVENOUS

## 2019-12-25 MED ORDER — MIDAZOLAM HCL 2 MG/2ML IJ SOLN
INTRAMUSCULAR | Status: AC
  Filled 2019-12-25: qty 2

## 2019-12-25 MED ORDER — CEFAZOLIN SODIUM-DEXTROSE 2-4 GM/100ML-% IV SOLN
INTRAVENOUS | Status: AC
  Filled 2019-12-25: qty 100

## 2019-12-25 MED ORDER — PROPOFOL 10 MG/ML IV BOLUS
INTRAVENOUS | Status: DC | PRN
  Administered 2019-12-25: 200 mg via INTRAVENOUS
  Administered 2019-12-25: 100 mg via INTRAVENOUS

## 2019-12-25 MED ORDER — MEPERIDINE HCL 25 MG/ML IJ SOLN: 6.2500 mg | INTRAMUSCULAR | Status: DC | PRN

## 2019-12-25 MED ORDER — ONDANSETRON HCL 4 MG/2ML IJ SOLN
INTRAMUSCULAR | Status: AC
  Filled 2019-12-25: qty 2

## 2019-12-25 MED ORDER — FENTANYL CITRATE (PF) 100 MCG/2ML IJ SOLN
25.0000 ug | INTRAMUSCULAR | Status: DC | PRN
  Administered 2019-12-25 (×2): 25 ug via INTRAVENOUS

## 2019-12-25 MED ORDER — LIDOCAINE 2% (20 MG/ML) 5 ML SYRINGE
INTRAMUSCULAR | Status: DC | PRN
  Administered 2019-12-25: 100 mg via INTRAVENOUS

## 2019-12-25 MED ORDER — FENTANYL CITRATE (PF) 100 MCG/2ML IJ SOLN
INTRAMUSCULAR | Status: DC | PRN
  Administered 2019-12-25 (×2): 50 ug via INTRAVENOUS

## 2019-12-25 MED ORDER — OXYCODONE HCL 5 MG PO TABS
ORAL_TABLET | ORAL | Status: AC
  Filled 2019-12-25: qty 1

## 2019-12-25 MED ORDER — LACTATED RINGERS IV SOLN
INTRAVENOUS | Status: DC
  Administered 2019-12-25: 1000 mL via INTRAVENOUS

## 2019-12-25 MED ORDER — HYDROCODONE-ACETAMINOPHEN 7.5-300 MG PO TABS: 1.0000 | ORAL_TABLET | Freq: Four times a day (QID) | ORAL | 0 refills | Status: DC | PRN

## 2019-12-25 SURGICAL SUPPLY — 35 items
APPLIER CLIP 11 MED OPEN (CLIP) ×2
APR CLP MED 11 20 MLT OPN (CLIP) ×1
BLADE SURG 15 STRL LF DISP TIS (BLADE) ×1 IMPLANT
BLADE SURG 15 STRL SS (BLADE) ×2
BNDG GAUZE ELAST 4 BULKY (GAUZE/BANDAGES/DRESSINGS) ×2 IMPLANT
CLIP APPLIE 11 MED OPEN (CLIP) ×1 IMPLANT
COVER BACK TABLE 60X90IN (DRAPES) ×2 IMPLANT
COVER MAYO STAND STRL (DRAPES) ×2 IMPLANT
COVER WAND RF STERILE (DRAPES) ×2 IMPLANT
DRAIN PENROSE 0.25X18 (DRAIN) IMPLANT
DRAPE LAPAROTOMY 100X72 PEDS (DRAPES) ×2 IMPLANT
DRSG EMULSION OIL 3X3 NADH (GAUZE/BANDAGES/DRESSINGS) ×2 IMPLANT
ELECT REM PT RETURN 9FT ADLT (ELECTROSURGICAL) ×2
ELECTRODE REM PT RTRN 9FT ADLT (ELECTROSURGICAL) ×1 IMPLANT
GLOVE BIO SURGEON STRL SZ8 (GLOVE) IMPLANT
GLOVE BIOGEL M STRL SZ7.5 (GLOVE) ×2 IMPLANT
GOWN STRL REUS W/TWL LRG LVL3 (GOWN DISPOSABLE) ×2 IMPLANT
KIT TURNOVER CYSTO (KITS) ×2 IMPLANT
NEEDLE HYPO 22GX1.5 SAFETY (NEEDLE) ×2 IMPLANT
NS IRRIG 500ML POUR BTL (IV SOLUTION) ×2 IMPLANT
PACK BASIN DAY SURGERY FS (CUSTOM PROCEDURE TRAY) ×2 IMPLANT
PENCIL BUTTON HOLSTER BLD 10FT (ELECTRODE) ×2 IMPLANT
SPONGE LAP 4X18 RFD (DISPOSABLE) ×2 IMPLANT
SUPPORT SCROTAL LG STRP (MISCELLANEOUS) IMPLANT
SUPPORT SCROTAL MED ADLT STRP (MISCELLANEOUS) IMPLANT
SUT CHROMIC 3 0 SH 27 (SUTURE) IMPLANT
SUT PDS AB 4-0 RB1 27 (SUTURE) IMPLANT
SUT VIC AB 2-0 SH 27 (SUTURE) ×4
SUT VIC AB 2-0 SH 27XBRD (SUTURE) ×2 IMPLANT
SYR BULB IRRIG 60ML STRL (SYRINGE) ×2 IMPLANT
SYR CONTROL 10ML LL (SYRINGE) ×2 IMPLANT
TRAY DSU PREP LF (CUSTOM PROCEDURE TRAY) ×2 IMPLANT
TUBE CONNECTING 12X1/4 (SUCTIONS) ×2 IMPLANT
WATER STERILE IRR 500ML POUR (IV SOLUTION) IMPLANT
YANKAUER SUCT BULB TIP NO VENT (SUCTIONS) ×2 IMPLANT

## 2019-12-25 NOTE — Interval H&P Note (Signed)
History and Physical Interval Note:  12/25/2019 8:49 AM  Darin Chaney  has presented today for surgery, with the diagnosis of RIGHT HYDROCELE.  The various methods of treatment have been discussed with the patient and family. After consideration of risks, benefits and other options for treatment, the patient has consented to  Procedure(s): HYDROCELECTOMY ADULT (Right) as a surgical intervention.  The patient's history has been reviewed, patient examined, no change in status, stable for surgery.  I have reviewed the patient's chart and labs. Pt also somewhat buried on exam and discussed hydrocele repair may not help that because it is due to large loose SP fat pad. Also, he had a rash and vomiting with daily PCN injections as a kid, no trouble breathing or recent reactions. Discussed use of cefazolin and we will proceed with it. Questions were answered to the patient's satisfaction.  He elects to proceed.    Festus Aloe

## 2019-12-25 NOTE — Anesthesia Postprocedure Evaluation (Signed)
Anesthesia Post Note  Patient: Darin Chaney  Procedure(s) Performed: Randall Hiss  ADULT (Right )     Patient location during evaluation: PACU Anesthesia Type: General Level of consciousness: sedated and patient cooperative Pain management: pain level controlled Vital Signs Assessment: post-procedure vital signs reviewed and stable Respiratory status: spontaneous breathing Cardiovascular status: stable Anesthetic complications: no   No complications documented.  Last Vitals:  Vitals:   12/25/19 1130 12/25/19 1207  BP: 132/73 136/81  Pulse: 73 73  Resp: 17 18  Temp:  36.4 C  SpO2: 93% 96%    Last Pain:  Vitals:   12/25/19 1207  TempSrc:   PainSc: [place]

## 2019-12-25 NOTE — Transfer of Care (Signed)
Immediate Anesthesia Transfer of Care Note  Patient: Darin Chaney  Procedure(s) Performed: Randall Hiss  ADULT (Right )  Patient Location: PACU  Anesthesia Type:General  Level of Consciousness: awake, alert  and oriented  Airway & Oxygen Therapy: Patient Spontanous Breathing and Patient connected to nasal cannula oxygen  Post-op Assessment: Report given to RN and Post -op Vital signs reviewed and stable  Post vital signs: Reviewed and stable  Last Vitals:  Vitals Value Taken Time  BP 140/79 12/25/19 1115  Temp 36.8 C 12/25/19 1043  Pulse 73 12/25/19 1118  Resp 16 12/25/19 1118  SpO2 94 % 12/25/19 1118  Vitals shown include unvalidated device data.  Last Pain:  Vitals:   12/25/19 1100  TempSrc:   PainSc: 8       Patients Stated Pain Goal: 6 (83/33/83 2919)  Complications: No complications documented.

## 2019-12-25 NOTE — Anesthesia Procedure Notes (Signed)
Procedure Name: LMA Insertion Date/Time: 12/25/2019 9:04 AM Performed by: Leontyne Manville, Clinical cytogeneticist D, CRNA Pre-anesthesia Checklist: Patient identified, Emergency Drugs available, Suction available and Patient being monitored Patient Re-evaluated:Patient Re-evaluated prior to induction Oxygen Delivery Method: Circle system utilized Preoxygenation: Pre-oxygenation with 100% oxygen Induction Type: IV induction Ventilation: Mask ventilation without difficulty LMA Size: 5.0 Tube type: Oral Number of attempts: 1 Airway Equipment and Method: Oral airway Placement Confirmation: positive ETCO2 and breath sounds checked- equal and bilateral Tube secured with: Tape Dental Injury: Teeth and Oropharynx as per pre-operative assessment  Comments: Front lower tooth noted to be loose after airway insertion.  Decision made to remove for safety issuses

## 2019-12-25 NOTE — Anesthesia Preprocedure Evaluation (Addendum)
Anesthesia Evaluation  Patient identified by MRN, date of birth, ID band Patient awake    Reviewed: Allergy & Precautions, NPO status , Patient's Chart, lab work & pertinent test results  Airway Mallampati: III  TM Distance: >3 FB Neck ROM: Full    Dental  (+) Poor Dentition, Loose, Dental Advisory Given   Pulmonary sleep apnea , Current Smoker,    Pulmonary exam normal breath sounds clear to auscultation       Cardiovascular hypertension, Pt. on medications Normal cardiovascular exam Rhythm:Regular Rate:Normal     Neuro/Psych PSYCHIATRIC DISORDERS Anxiety negative neurological ROS     GI/Hepatic negative GI ROS, Neg liver ROS,   Endo/Other  diabetesMorbid obesity  Renal/GU negative Renal ROS     Musculoskeletal  (+) Arthritis ,   Abdominal (+) + obese,   Peds  Hematology negative hematology ROS (+)   Anesthesia Other Findings   Reproductive/Obstetrics                            Anesthesia Physical Anesthesia Plan  ASA: III  Anesthesia Plan: General   Post-op Pain Management:    Induction: Intravenous  PONV Risk Score and Plan: 2 and Ondansetron, Treatment may vary due to age or medical condition and Midazolam  Airway Management Planned: LMA  Additional Equipment: None  Intra-op Plan:   Post-operative Plan: Extubation in OR  Informed Consent: I have reviewed the patients History and Physical, chart, labs and discussed the procedure including the risks, benefits and alternatives for the proposed anesthesia with the patient or authorized representative who has indicated his/her understanding and acceptance.     Dental advisory given  Plan Discussed with: CRNA  Anesthesia Plan Comments:        Anesthesia Quick Evaluation

## 2019-12-25 NOTE — OR Nursing (Signed)
PATIENTS TOOTH PLACED IN CUP LABELED  AND TAKEN TO PACU

## 2019-12-25 NOTE — Discharge Instructions (Signed)
Post Anesthesia Home Care Instructions  Activity: Get plenty of rest for the remainder of the day. A responsible adult should stay with you for 24 hours following the procedure.  For the next 24 hours, DO NOT: -Drive a car -Paediatric nurse -Drink alcoholic beverages -Take any medication unless instructed by your physician -Make any legal decisions or sign important papers.  Meals: Start with liquid foods such as gelatin or soup. Progress to regular foods as tolerated. Avoid greasy, spicy, heavy foods. If nausea and/or vomiting occur, drink only clear liquids until the nausea and/or vomiting subsides. Call your physician if vomiting continues.  Special Instructions/Symptoms: Your throat may feel dry or sore from the anesthesia or the breathing tube placed in your throat during surgery. If this causes discomfort, gargle with warm salt water. The discomfort should disappear within 24 hours.  If you had a scopolamine patch placed behind your ear for the management of post- operative nausea and/or vomiting:  1. The medication in the patch is effective for 72 hours, after which it should be removed.  Wrap patch in a tissue and discard in the trash. Wash hands thoroughly with soap and water. 2. You may remove the patch earlier than 72 hours if you experience unpleasant side effects which may include dry mouth, dizziness or visual disturbances. 3. Avoid touching the patch. Wash your hands with soap and water after contact with the patch.    Hydrocelectomy, Spermatocelectomy, Adult, Care After This sheet gives you information about how to care for yourself after your procedure. Your health care provider may also give you more specific instructions. If you have problems or questions, contact your health care provider. What can I expect after the procedure? After your procedure, it is common to have:  Mild discomfort and swelling in the pouch that holds your testicles (scrotum).  Bruising of  the scrotum. Follow these instructions at home: Medicines  Ice the scrotum today for 30 minutes on and 20 minutes off to help prevent swelling   Take over-the-counter and prescription medicines only as told by your health care provider.  Ask your health care provider if the medicine prescribed to you: ? Requires you to avoid driving or using heavy machinery. ? Can cause constipation. You may need to take these actions to prevent or treat constipation:  Drink enough fluid to keep your urine pale yellow.  Take over-the-counter or prescription medicines.  Eat foods that are high in fiber, such as beans, whole grains, and fresh fruits and vegetables.  Limit foods that are high in fat and processed sugars, such as fried or sweet foods. Bathing  Do not take baths, swim, or use a hot tub until your health care provider approves.   You may shower tomorrow, 12/26/2019   Wear an athletic support strap (scrotal support), keep it dry. Take it off when you shower or bathe. Incision care   Follow instructions from your health care provider about how to take care of your incision. Make sure you: ? Wash your hands with soap and water before and after you change your bandage (dressing). If soap and water are not available, use hand sanitizer. ? Change your dressing as told by your health care provider. ? Leave stitches (sutures), skin glue, or adhesive strips in place. These skin closures may need to stay in place for 2 weeks or longer. If adhesive strip edges start to loosen and curl up, you may trim the loose edges. Do not remove adhesive strips completely unless your  health care provider tells you to do that.  Check your incision and scrotum every day for signs of infection. Check for: ? More redness, swelling, or pain. ? Fluid or blood. ? Warmth. ? Pus or a bad smell. Managing pain and swelling If directed, put ice on the affected area. To do this:  Put ice in a plastic bag.  Place a  towel between your skin and the bag.  Leave the ice on for 20 minutes, 2-3 times per day.  Activity  Do not do any high-energy activities for as long as told by your health care provider.  Do not lift anything that is heavier than 10 lb (4.5 kg), or the limit that you are told, until your health care provider says that it is safe.  Return to your normal activities as told by your health care provider. Ask your health care provider what activities are safe for you.  Do not drive for 24 hours if you were given a sedative during your procedure.  Ask your health care provider when it is safe to drive. General instructions  Do not use any products that contain nicotine or tobacco, such as cigarettes, e-cigarettes, and chewing tobacco. These can delay incision healing after surgery. If you need help quitting, ask your health care provider.  If you were given a scrotal support, wear it as told by your health care provider.  If you had a drain put in during the procedure, you will need to have it removed at a follow-up visit.  Keep all follow-up visits as told by your health care provider. This is important. Contact a health care provider if:  Your pain is not controlled with medicine.  You have more redness, swelling, or pain around your scrotum.  You have fluid or blood coming from your incision.  Your incision feels warm to the touch.  You have pus or a bad smell coming from your scrotum.  You have a fever. Get help right away if:  You develop shaking, chills, and a fever that is higher than 101.49F (38.8C).  You have redness or swelling that starts at your scrotum and spreads outward to cover your whole groin.  You develop swelling of the legs or difficulty breathing. Summary  After a hydrocelectomy, it is common to have mild discomfort, swelling, and bruising.  Do not take baths, swim, or use a hot tub until your health care provider approves. Ask your health care  provider if you may take showers.  If directed, put ice on the affected area to help with pain and swelling.  Do not do any high-energy activities or lift anything heavier than 10 lb (4.5 kg) for as long as told by your health care provider.  If you were given a scrotal support, keep it dry. Wear the scrotal support as told by your health care provider. This information is not intended to replace advice given to you by your health care provider. Make sure you discuss any questions you have with your health care provider. Document Revised: 10/03/2018 Document Reviewed: 10/03/2018 Elsevier Patient Education  Darin Chaney.

## 2019-12-25 NOTE — Op Note (Signed)
Preoperative diagnosis: Right hydrocele Postoperative diagnosis: Right hydrocele, right spermatocele  Procedure: Right hydrocelectomy, spermatocelectomy  Surgeon: Junious Silk  Anesthesia: General  Indication for procedure: Mr. Darin Chaney is a 64 year old male with bothersome right scrotal swelling.  Exam and ultrasound consistent with a right hydrocele although looking back again at the ultrasound there may be several septations giving an indication of a spermatocele.  He was brought for repair today.  Findings: Penis was circumcised without mass or lesion.  Left testicle palpably normal.  Small right hydrocele and large right spermatocele once testicle delivered.  Right testicle palpably normal.  Glans and meatus normal.  Description of procedure: After consent was obtained patient brought to the operating room and placed supine on the table.  After adequate anesthesia the external genitalia were shaved with clippers and prepped and draped in the usual sterile fashion.  A timeout was performed confirm the patient and procedure.  Local was injected into the right hemiscrotum and a right transverse incision was made over about 3 cm.  Dartos fascia was dissected off the testicle and it was delivered.  Tunica vaginalis was opened to reveal a small hydrocele and the fluid drained of about 50 cc.  The remaining fluid was a large spermatocele.  Tunica vaginalis was opened over the spermatocele and carefully the spermatocele was dissected off of the cord all the way back down to the epididymis in its entirety without rupture decompression.  Finally the spermatocele was transected from the epididymis and was still intact.  The spermatocele was opened and drained and about 175 cc of fluid were contained.  The fluid was straw-colored and the spermatocele was simple cyst.  It was not sent for pathology.  The testicle was inspected and noted to be normal.  There was some slight oozing on the surface of the epididymis and  testicle and this was oversewn with a 2-0 Vicryl.  There was a superficial vein on the anterior cord that was oozing and it was controlled with a small clip.  The testicle and cord were carefully inspected adequate hemostasis was meticulously ensured.  I then reconstructed the epididymis by tacking down the remaining epididymis and tunica albuginea to the anterior cord.  Again the testicle was inspected and adequate hemostasis was insured.  The testicle was irrigated and then dropped back into the right hemiscrotum without torsion.  The dartos fascia was then run closed with a 2-0 Vicryl suture.  The skin was closed with a horizontal mattress 3-0 chromic.  Telfa fluffs and a jockstrap were placed.  He was awakened and taken recovery room in stable condition.  Complications: None.  Of note the patient did complain of a loose tooth and it was removed by anesthesia because it was at risk for falling out and aspiration.  Tooth was given to the patient.  Blood loss: Minimal  Specimens: None  Drains: None  Disposition: Patient stable to PACU

## 2019-12-26 ENCOUNTER — Encounter (HOSPITAL_BASED_OUTPATIENT_CLINIC_OR_DEPARTMENT_OTHER): Payer: Self-pay | Admitting: Urology

## 2020-01-08 DIAGNOSIS — N4341 Spermatocele of epididymis, single: Secondary | ICD-10-CM | POA: Diagnosis not present

## 2020-01-08 DIAGNOSIS — N430 Encysted hydrocele: Secondary | ICD-10-CM | POA: Diagnosis not present

## 2020-01-22 DIAGNOSIS — N4341 Spermatocele of epididymis, single: Secondary | ICD-10-CM | POA: Diagnosis not present

## 2020-02-25 DIAGNOSIS — G4733 Obstructive sleep apnea (adult) (pediatric): Secondary | ICD-10-CM | POA: Diagnosis not present

## 2020-03-04 DIAGNOSIS — Z85828 Personal history of other malignant neoplasm of skin: Secondary | ICD-10-CM | POA: Diagnosis not present

## 2020-03-04 DIAGNOSIS — L720 Epidermal cyst: Secondary | ICD-10-CM | POA: Diagnosis not present

## 2020-03-04 DIAGNOSIS — D225 Melanocytic nevi of trunk: Secondary | ICD-10-CM | POA: Diagnosis not present

## 2020-03-04 DIAGNOSIS — L820 Inflamed seborrheic keratosis: Secondary | ICD-10-CM | POA: Diagnosis not present

## 2020-03-04 DIAGNOSIS — L570 Actinic keratosis: Secondary | ICD-10-CM | POA: Diagnosis not present

## 2020-04-22 DIAGNOSIS — N401 Enlarged prostate with lower urinary tract symptoms: Secondary | ICD-10-CM | POA: Diagnosis not present

## 2020-04-22 DIAGNOSIS — R3914 Feeling of incomplete bladder emptying: Secondary | ICD-10-CM | POA: Diagnosis not present

## 2020-10-23 DIAGNOSIS — N401 Enlarged prostate with lower urinary tract symptoms: Secondary | ICD-10-CM | POA: Diagnosis not present

## 2020-10-23 DIAGNOSIS — R3914 Feeling of incomplete bladder emptying: Secondary | ICD-10-CM | POA: Diagnosis not present

## 2020-12-17 DIAGNOSIS — Z008 Encounter for other general examination: Secondary | ICD-10-CM | POA: Diagnosis not present

## 2020-12-17 DIAGNOSIS — Z0000 Encounter for general adult medical examination without abnormal findings: Secondary | ICD-10-CM | POA: Diagnosis not present

## 2020-12-17 DIAGNOSIS — R82998 Other abnormal findings in urine: Secondary | ICD-10-CM | POA: Diagnosis not present

## 2020-12-17 DIAGNOSIS — E1149 Type 2 diabetes mellitus with other diabetic neurological complication: Secondary | ICD-10-CM | POA: Diagnosis not present

## 2020-12-27 DIAGNOSIS — E119 Type 2 diabetes mellitus without complications: Secondary | ICD-10-CM | POA: Diagnosis not present

## 2020-12-27 DIAGNOSIS — H2513 Age-related nuclear cataract, bilateral: Secondary | ICD-10-CM | POA: Diagnosis not present

## 2020-12-27 DIAGNOSIS — E089 Diabetes mellitus due to underlying condition without complications: Secondary | ICD-10-CM | POA: Diagnosis not present

## 2020-12-27 DIAGNOSIS — H04123 Dry eye syndrome of bilateral lacrimal glands: Secondary | ICD-10-CM | POA: Diagnosis not present

## 2020-12-27 DIAGNOSIS — H524 Presbyopia: Secondary | ICD-10-CM | POA: Diagnosis not present

## 2021-01-30 DIAGNOSIS — K432 Incisional hernia without obstruction or gangrene: Secondary | ICD-10-CM | POA: Diagnosis not present

## 2021-02-03 ENCOUNTER — Other Ambulatory Visit: Payer: Self-pay | Admitting: General Surgery

## 2021-02-03 DIAGNOSIS — K432 Incisional hernia without obstruction or gangrene: Secondary | ICD-10-CM

## 2021-02-14 ENCOUNTER — Ambulatory Visit
Admission: RE | Admit: 2021-02-14 | Discharge: 2021-02-14 | Disposition: A | Discharge status: Home / Self Care | Payer: BC Managed Care – PPO | Source: Ambulatory Visit | Attending: General Surgery | Admitting: General Surgery

## 2021-02-14 DIAGNOSIS — K5730 Diverticulosis of large intestine without perforation or abscess without bleeding: Secondary | ICD-10-CM | POA: Diagnosis not present

## 2021-02-14 DIAGNOSIS — R109 Unspecified abdominal pain: Secondary | ICD-10-CM | POA: Diagnosis not present

## 2021-02-14 DIAGNOSIS — K432 Incisional hernia without obstruction or gangrene: Secondary | ICD-10-CM

## 2021-02-24 ENCOUNTER — Ambulatory Visit: Payer: Self-pay | Admitting: General Surgery

## 2021-02-24 DIAGNOSIS — K432 Incisional hernia without obstruction or gangrene: Secondary | ICD-10-CM | POA: Diagnosis not present

## 2021-02-24 NOTE — H&P (Signed)
vChief Complaint: Follow-up       History of Present Illness: Darin Chaney is a 65 y.o. male who is seen today as an office consultation at the request of Dr. Reynaldo Chaney for evaluation of Follow-up .   Patient is a 65 year old male who comes in secondary to a incisional hernia.  Patient had a previous ex lap secondary to trauma. Patient underwent CT scan follows up from CT scan.  CT scan does show Swiss cheese defect in his upper abdomen.  The hernia defect not very wide.  I did review the CT scan personally.   Patient states he is stopped smoking.           Review of Systems: A complete review of systems was obtained from the patient.  I have reviewed this information and discussed as appropriate with the patient.  See HPI as well for other ROS.   Review of Systems  Constitutional: Negative for fever.  HENT: Negative for congestion.   Eyes: Negative for blurred vision.  Respiratory: Negative for cough, shortness of breath and wheezing.   Cardiovascular: Negative for chest pain and palpitations.  Gastrointestinal: Negative for heartburn.  Genitourinary: Negative for dysuria.  Musculoskeletal: Negative for myalgias.  Skin: Negative for rash.  Neurological: Negative for dizziness and headaches.  Psychiatric/Behavioral: Negative for depression and suicidal ideas.  All other systems reviewed and are negative.       Medical History: Past Medical History Past Medical History: Diagnosis Date  Arthritis    Diabetes mellitus without complication (CMS-HCC)    Hyperlipidemia    Sleep apnea        There is no problem list on file for this patient.     Past Surgical History Past Surgical History: Procedure Laterality Date  bladder surgery          Allergies Allergies Allergen Reactions  Penicillins Nausea and Rash      Current Outpatient Medications on File Prior to Visit Medication Sig Dispense Refill  buPROPion (WELLBUTRIN XL) 150 MG XL tablet Take 150 mg by  mouth once daily      diltiazem (DILT-XR) 120 MG XR capsule DILT-XR 120 mg capsule, extended release      escitalopram oxalate (LEXAPRO) 10 MG tablet Take 10 mg by mouth once daily      levothyroxine (SYNTHROID) 50 MCG tablet levothyroxine 50 mcg tablet  TAKE 1 TABLET BY MOUTH ONCE DAILY      metFORMIN (GLUCOPHAGE) 500 MG tablet metformin 500 mg tablet      pravastatin (PRAVACHOL) 40 MG tablet pravastatin 40 mg tablet      zolpidem (AMBIEN) 10 mg tablet zolpidem 10 mg tablet       No current facility-administered medications on file prior to visit.     Family History Family History Problem Relation Age of Onset  High blood pressure (Hypertension) Mother    Breast cancer Mother    High blood pressure (Hypertension) Father    Coronary Artery Disease (Blocked arteries around heart) Father        Social History   Tobacco Use Smoking Status Current Every Day Smoker Smokeless Tobacco Never Used     Social History Social History    Socioeconomic History  Marital status: Married Tobacco Use  Smoking status: Current Every Day Smoker  Smokeless tobacco: Never Used [employer], mining Use: Never used Substance and Sexual Activity  Alcohol use: Never  Drug use: Never      Objective:     There were no  vitals filed for this visit.  There is no height or weight on file to calculate BMI.   Physical Exam Constitutional:      Appearance: Normal appearance.  HENT:     Head: Normocephalic and atraumatic.     Nose: Nose normal. No congestion.     Mouth/Throat:     Mouth: Mucous membranes are moist.     Pharynx: Oropharynx is clear.  Eyes:     Pupils: Pupils are equal, round, and reactive to light.  Cardiovascular:     Rate and Rhythm: Normal rate and regular rhythm.     Pulses: Normal pulses.     Heart sounds: Normal heart sounds. No murmur heard.   No friction rub. No gallop.  Pulmonary:     Effort: Pulmonary effort is normal. No respiratory distress.     Breath  sounds: Normal breath sounds. No stridor. No wheezing, rhonchi or rales.  Abdominal:     General: Abdomen is flat.    Musculoskeletal:        General: Normal range of motion.     Cervical back: Normal range of motion.  Skin:    General: Skin is warm and dry.  Neurological:     General: No focal deficit present.     Mental Status: He is alert and oriented to person, place, and time.  Psychiatric:        Mood and Affect: Mood normal.        Thought Content: Thought content normal.        Assessment and Plan: Diagnoses and all orders for this visit:   Incisional hernia without obstruction or gangrene     Darin Chaney is a 65 y.o. male    1.  We will proceed to the OR for a lap ventral hernia repair with mesh, lysis of adhesions. 2. All risks and benefits were discussed with the patient, to generally include infection, bleeding, damage to surrounding structures, acute and chronic nerve pain, and recurrence. Alternatives were offered and described.  All questions were answered and the patient voiced understanding of the procedure and wishes to proceed at this point.             No follow-ups on file.   Ralene Ok, MD, [facility], [facility]

## 2021-05-19 NOTE — Pre-Procedure Instructions (Signed)
Surgical Instructions    Your procedure is scheduled on Thursday 05/28/21.   Report to [facility] Main Entrance "A" at 07:30 A.M., then check in with the Admitting office.  Call this number if you have problems the morning of surgery:  [phone]   If you have any questions prior to your surgery date call [phone]: Open Monday-Friday 8am-4pm    Remember:  Do not eat after midnight the night before your surgery  You may drink clear liquids until 06:30 A.M. the morning of your surgery.   Clear liquids allowed are: Water, Non-Citrus Juices (without pulp), Carbonated Beverages, Clear Tea, Black Coffee ONLY (NO MILK, CREAM OR POWDERED CREAMER of any kind), and Gatorade    Take these medicines the morning of surgery with A SIP OF WATER   buPROPion (WELLBUTRIN XL)   diltiazem (DILACOR XR)  escitalopram (LEXAPRO)  levothyroxine (SYNTHROID)  pravastatin (PRAVACHOL)  As of today, STOP taking any Aspirin (unless otherwise instructed by your surgeon) Aleve, Naproxen, Ibuprofen, Motrin, Advil, Goody's, BC's, all herbal medications, fish oil, and all vitamins.   WHAT DO I DO ABOUT MY DIABETES MEDICATION?   Do not take oral diabetes medicines (pills) the morning of surgery.  DO NOT TAKE metFORMIN (GLUCOPHAGE) the morning of surgery.   HOW TO MANAGE YOUR DIABETES BEFORE AND AFTER SURGERY  Why is it important to control my blood sugar before and after surgery? Improving blood sugar levels before and after surgery helps healing and can limit problems. A way of improving blood sugar control is eating a healthy diet by:  Eating less sugar and carbohydrates  Increasing activity/exercise  Talking with your doctor about reaching your blood sugar goals High blood sugars (greater than 180 mg/dL) can raise your risk of infections and slow your recovery, so you will need to focus on controlling your diabetes during the weeks before surgery. Make sure that the doctor who takes care of your  diabetes knows about your planned surgery including the date and location.  How do I manage my blood sugar before surgery? Check your blood sugar at least 4 times a day, starting 2 days before surgery, to make sure that the level is not too high or low.  Check your blood sugar the morning of your surgery when you wake up and every 2 hours until you get to the Short Stay unit.  If your blood sugar is less than 70 mg/dL, you will need to treat for low blood sugar: Do not take insulin. Treat a low blood sugar (less than 70 mg/dL) with  cup of clear juice (cranberry or apple), 4 glucose tablets, OR glucose gel. Recheck blood sugar in 15 minutes after treatment (to make sure it is greater than 70 mg/dL). If your blood sugar is not greater than 70 mg/dL on recheck, call [phone] for further instructions. Report your blood sugar to the short stay nurse when you get to Short Stay.  If you are admitted to the hospital after surgery: Your blood sugar will be checked by the staff and you will probably be given insulin after surgery (instead of oral diabetes medicines) to make sure you have good blood sugar levels. The goal for blood sugar control after surgery is 80-180 mg/dL.   After your COVID test   You are not required to quarantine however you are required to wear a well-fitting mask when you are out and around people not in your household.  If your mask becomes wet or soiled, replace with a new  one.  Wash your hands often with soap and water for 20 seconds or clean your hands with an alcohol-based hand sanitizer that contains at least 60% alcohol.  Do not share personal items.  Notify your provider: if you are in close contact with someone who has COVID  or if you develop a fever of 100.4 or greater, sneezing, cough, sore throat, shortness of breath or body aches.             Do not wear jewelry or makeup Do not wear lotions, powders, perfumes/colognes, or deodorant. Do not shave 48  hours prior to surgery.  Men may shave face and neck. Do not bring valuables to the hospital. DO Not wear nail polish, gel polish, artificial nails, or any other type of covering on natural nails including finger and toenails. If patients have artificial nails, gel coating, etc. that need to be removed by a nail salon, please have this removed prior to surgery or surgery may need to be canceled/delayed if the surgeon/ anesthesia feels like the patient is unable to be adequately monitored.             [facility] is not responsible for any belongings or valuables.  Do NOT Smoke (Tobacco/Vaping)  24 hours prior to your procedure  If you use a CPAP at night, you may bring your mask for your overnight stay.   Contacts, glasses, hearing aids, dentures or partials may not be worn into surgery, please bring cases for these belongings   For patients admitted to the hospital, discharge time will be determined by your treatment team.   Patients discharged the day of surgery will not be allowed to drive home, and someone needs to stay with them for 24 hours.  NO VISITORS WILL BE ALLOWED IN PRE-OP WHERE PATIENTS ARE PREPPED FOR SURGERY.  ONLY 1 SUPPORT PERSON MAY BE PRESENT IN THE WAITING ROOM WHILE YOU ARE IN SURGERY.  IF YOU ARE TO BE ADMITTED, ONCE YOU ARE IN YOUR ROOM YOU WILL BE ALLOWED TWO (2) VISITORS. 1 (ONE) VISITOR MAY STAY OVERNIGHT BUT MUST ARRIVE TO THE ROOM BY 8pm.  Minor children may have two parents present. Special consideration for safety and communication needs will be reviewed on a case by case basis.  Special instructions:    Oral Hygiene is also important to reduce your risk of infection.  Remember - BRUSH YOUR TEETH THE MORNING OF SURGERY WITH YOUR REGULAR TOOTHPASTE   [facility]- Preparing For Surgery  Before surgery, you can play an important role. Because skin is not sterile, your skin needs to be as free of germs as possible. You can reduce the number of germs on your skin  by washing with CHG (chlorahexidine gluconate) Soap before surgery.  CHG is an antiseptic cleaner which kills germs and bonds with the skin to continue killing germs even after washing.     Please do not use if you have an allergy to CHG or antibacterial soaps. If your skin becomes reddened/irritated stop using the CHG.  Do not shave (including legs and underarms) for at least 48 hours prior to first CHG shower. It is OK to shave your face.  Please follow these instructions carefully.     Shower the NIGHT BEFORE SURGERY and the MORNING OF SURGERY with CHG Soap.   If you chose to wash your hair, wash your hair first as usual with your normal shampoo. After you shampoo, rinse your hair and body thoroughly to remove the shampoo.  Then [employer] and genitals (private parts) with your normal soap and rinse thoroughly to remove soap.  After that Use CHG Soap as you would any other liquid soap. You can apply CHG directly to the skin and wash gently with a scrungie or a clean washcloth.   Apply the CHG Soap to your body ONLY FROM THE NECK DOWN.  Do not use on open wounds or open sores. Avoid contact with your eyes, ears, mouth and genitals (private parts). Wash Face and genitals (private parts)  with your normal soap.   Wash thoroughly, paying special attention to the area where your surgery will be performed.  Thoroughly rinse your body with warm water from the neck down.  DO NOT shower/wash with your normal soap after using and rinsing off the CHG Soap.  Pat yourself dry with a CLEAN TOWEL.  Wear CLEAN PAJAMAS to bed the night before surgery  Place CLEAN SHEETS on your bed the night before your surgery  DO NOT SLEEP WITH PETS.   Day of Surgery:  Take a shower with CHG soap. Wear Clean/Comfortable clothing the morning of surgery Do not apply any deodorants/lotions.   Remember to brush your teeth WITH YOUR REGULAR TOOTHPASTE.   Please read over the following fact sheets that you were  given.

## 2021-05-20 ENCOUNTER — Encounter (HOSPITAL_COMMUNITY): Payer: Self-pay

## 2021-05-20 ENCOUNTER — Other Ambulatory Visit: Payer: Self-pay

## 2021-05-20 ENCOUNTER — Encounter (HOSPITAL_COMMUNITY)
Admission: RE | Admit: 2021-05-20 | Discharge: 2021-05-20 | Disposition: A | Discharge status: Home / Self Care | Payer: BC Managed Care – PPO | Source: Ambulatory Visit | Attending: General Surgery | Admitting: General Surgery

## 2021-05-20 VITALS — BP 165/86 | HR 99 | Temp 98.5°F | Resp 19 | Ht 71.0 in | Wt 275.0 lb

## 2021-05-20 DIAGNOSIS — F17210 Nicotine dependence, cigarettes, uncomplicated: Secondary | ICD-10-CM | POA: Diagnosis not present

## 2021-05-20 DIAGNOSIS — I10 Essential (primary) hypertension: Secondary | ICD-10-CM | POA: Insufficient documentation

## 2021-05-20 DIAGNOSIS — E785 Hyperlipidemia, unspecified: Secondary | ICD-10-CM | POA: Diagnosis not present

## 2021-05-20 DIAGNOSIS — Z85828 Personal history of other malignant neoplasm of skin: Secondary | ICD-10-CM | POA: Diagnosis not present

## 2021-05-20 DIAGNOSIS — E669 Obesity, unspecified: Secondary | ICD-10-CM | POA: Diagnosis not present

## 2021-05-20 DIAGNOSIS — K432 Incisional hernia without obstruction or gangrene: Secondary | ICD-10-CM | POA: Diagnosis not present

## 2021-05-20 DIAGNOSIS — Z79890 Hormone replacement therapy (postmenopausal): Secondary | ICD-10-CM | POA: Diagnosis not present

## 2021-05-20 DIAGNOSIS — Z7984 Long term (current) use of oral hypoglycemic drugs: Secondary | ICD-10-CM | POA: Diagnosis not present

## 2021-05-20 DIAGNOSIS — R9431 Abnormal electrocardiogram [ECG] [EKG]: Secondary | ICD-10-CM | POA: Diagnosis not present

## 2021-05-20 DIAGNOSIS — Z9989 Dependence on other enabling machines and devices: Secondary | ICD-10-CM | POA: Insufficient documentation

## 2021-05-20 DIAGNOSIS — E039 Hypothyroidism, unspecified: Secondary | ICD-10-CM | POA: Diagnosis not present

## 2021-05-20 DIAGNOSIS — Z01818 Encounter for other preprocedural examination: Secondary | ICD-10-CM | POA: Diagnosis not present

## 2021-05-20 DIAGNOSIS — Z6838 Body mass index (BMI) 38.0-38.9, adult: Secondary | ICD-10-CM | POA: Diagnosis not present

## 2021-05-20 DIAGNOSIS — E119 Type 2 diabetes mellitus without complications: Secondary | ICD-10-CM | POA: Diagnosis not present

## 2021-05-20 DIAGNOSIS — G4733 Obstructive sleep apnea (adult) (pediatric): Secondary | ICD-10-CM | POA: Diagnosis not present

## 2021-05-20 HISTORY — DX: Hypothyroidism, unspecified: E03.9

## 2021-05-20 LAB — CBC
HCT: 47.6 % (ref 39.0–52.0)
Hemoglobin: 16.2 g/dL (ref 13.0–17.0)
MCH: 30.9 pg (ref 26.0–34.0)
MCHC: 34 g/dL (ref 30.0–36.0)
MCV: 90.8 fL (ref 80.0–100.0)
Platelets: 246 10*3/uL (ref 150–400)
RBC: 5.24 MIL/uL (ref 4.22–5.81)
RDW: 12.5 % (ref 11.5–15.5)
WBC: 9.4 10*3/uL (ref 4.0–10.5)
nRBC: 0 % (ref 0.0–0.2)

## 2021-05-20 LAB — BASIC METABOLIC PANEL
Anion gap: 12 (ref 5–15)
BUN: 19 mg/dL (ref 8–23)
CO2: 23 mmol/L (ref 22–32)
Calcium: 9.9 mg/dL (ref 8.9–10.3)
Chloride: 100 mmol/L (ref 98–111)
Creatinine, Ser: 1.27 mg/dL — ABNORMAL HIGH (ref 0.61–1.24)
GFR, Estimated: >60 mL/min (ref >60)
Glucose, Bld: 225 mg/dL — ABNORMAL HIGH (ref 70–99)
Potassium: 4.4 mmol/L (ref 3.5–5.1)
Sodium: 135 mmol/L (ref 135–145)

## 2021-05-20 LAB — HEMOGLOBIN A1C
Hgb A1c MFr Bld: 7.4 % — ABNORMAL HIGH (ref 4.8–5.6)
Mean Plasma Glucose: 165.68 mg/dL

## 2021-05-20 LAB — GLUCOSE, CAPILLARY: Glucose-Capillary: 269 mg/dL — ABNORMAL HIGH (ref 70–99)

## 2021-05-20 NOTE — Progress Notes (Addendum)
PCP: Burnard Bunting, MD Cardiologist: denies Endocrinologist: denies.  Uses PCP  EKG: 05/20/21 CXR: na ECHO: denies Stress Test: denies Cardiac Cath: denies  Fasting Blood Sugar- not sure Checks Blood Sugar: "maybe every 2 weeks".  BG 269 at PAT.  Spoke with diabetes coordinator.   ASA/Blood Thinner: No  OSA/CPAP: Yes, wears nightly  Anesthesia Review: Yes, BG 269 at PAT  Patient denies shortness of breath, fever, cough, and chest pain at PAT appointment.  Patient verbalized understanding of instructions provided today at the PAT appointment.  Patient asked to review instructions at home and day of surgery.

## 2021-05-21 ENCOUNTER — Encounter (HOSPITAL_COMMUNITY): Payer: Self-pay

## 2021-05-21 NOTE — Anesthesia Preprocedure Evaluation (Addendum)
Anesthesia Evaluation  Patient identified by MRN, date of birth, ID band Patient awake    Reviewed: Allergy & Precautions, NPO status , Patient's Chart, lab work & pertinent test results  Airway Mallampati: III  TM Distance: >3 FB Neck ROM: Full    Dental  (+) Loose, Missing,    Pulmonary sleep apnea and Continuous Positive Airway Pressure Ventilation , Current Smoker and Patient abstained from smoking.,    Pulmonary exam normal        Cardiovascular hypertension, Pt. on medications  Rhythm:Regular Rate:Normal     Neuro/Psych Anxiety negative neurological ROS     GI/Hepatic Neg liver ROS, Incisional hernia    Endo/Other  diabetes, Type 2Hypothyroidism   Renal/GU negative Renal ROS  negative genitourinary   Musculoskeletal  (+) Arthritis , Osteoarthritis,    Abdominal Normal abdominal exam  (+)   Peds  Hematology negative hematology ROS (+)   Anesthesia Other Findings   Reproductive/Obstetrics                           Anesthesia Physical Anesthesia Plan  ASA: 2  Anesthesia Plan: General   Post-op Pain Management:    Induction: Intravenous  PONV Risk Score and Plan: 1 and Ondansetron, Dexamethasone and Treatment may vary due to age or medical condition  Airway Management Planned: Mask and Oral ETT  Additional Equipment: None  Intra-op Plan:   Post-operative Plan: Extubation in OR  Informed Consent: I have reviewed the patients History and Physical, chart, labs and discussed the procedure including the risks, benefits and alternatives for the proposed anesthesia with the patient or authorized representative who has indicated his/her understanding and acceptance.     Dental advisory given  Plan Discussed with: CRNA  Anesthesia Plan Comments: (PAT note written 05/21/2021 by Myra Gianotti, PA-C. Lab Results      Component                Value               Date                       WBC                      9.4                 05/20/2021                HGB                      16.2                05/20/2021                HCT                      47.6                05/20/2021                MCV                      90.8                05/20/2021                PLT  246                 05/20/2021           Lab Results      Component                Value               Date                      NA                       135                 05/20/2021                K                        4.4                 05/20/2021                CO2                      23                  05/20/2021                GLUCOSE                  225 (H)             05/20/2021                BUN                      19                  05/20/2021                CREATININE               1.27 (H)            05/20/2021                CALCIUM                  9.9                 05/20/2021                GFRNONAA                 >60                 05/20/2021          )      Anesthesia Quick Evaluation
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
## 2021-05-27 NOTE — H&P (Signed)
Chief Complaint: Follow-up       History of Present Illness: Darin Chaney is a 66 y.o. male who is seen today as an office consultation at the request of Dr. Reynaldo Chaney for evaluation of Follow-up .   Patient is a 66 year old male who comes in secondary to a incisional hernia.  Patient had a previous ex lap secondary to trauma. Patient underwent CT scan follows up from CT scan.  CT scan does show [name] defect in his upper abdomen.  The hernia defect not very wide.  I did review the CT scan personally.   Patient states he is stopped smoking.           Review of Systems: A complete review of systems was obtained from the patient.  I have reviewed this information and discussed as appropriate with the patient.  See HPI as well for other ROS.   Review of Systems  Constitutional: Negative for fever.  HENT: Negative for congestion.   Eyes: Negative for blurred vision.  Respiratory: Negative for cough, shortness of breath and wheezing.   Cardiovascular: Negative for chest pain and palpitations.  Gastrointestinal: Negative for heartburn.  Genitourinary: Negative for dysuria.  Musculoskeletal: Negative for myalgias.  Skin: Negative for rash.  Neurological: Negative for dizziness and headaches.  Psychiatric/Behavioral: Negative for depression and suicidal ideas.  All other systems reviewed and are negative.       Medical History: Past Medical History Past Medical History: Diagnosis Date  Arthritis    Diabetes mellitus without complication (CMS-HCC)    Hyperlipidemia    Sleep apnea        There is no problem list on file for this patient.     Past Surgical History Past Surgical History: Procedure Laterality Date  bladder surgery          Allergies Allergies Allergen Reactions  Penicillins Nausea and Rash      Current Outpatient Medications on File Prior to Visit Medication Sig Dispense Refill  buPROPion (WELLBUTRIN XL) 150 MG XL tablet Take 150 mg by  mouth once daily      diltiazem (DILT-XR) 120 MG XR capsule DILT-XR 120 mg capsule, extended release      escitalopram oxalate (LEXAPRO) 10 MG tablet Take 10 mg by mouth once daily      levothyroxine (SYNTHROID) 50 MCG tablet levothyroxine 50 mcg tablet  TAKE 1 TABLET BY MOUTH ONCE DAILY      metFORMIN (GLUCOPHAGE) 500 MG tablet metformin 500 mg tablet      pravastatin (PRAVACHOL) 40 MG tablet pravastatin 40 mg tablet      zolpidem (AMBIEN) 10 mg tablet zolpidem 10 mg tablet       No current facility-administered medications on file prior to visit.     Family History Family History Problem Relation Age of Onset  High blood pressure (Hypertension) Mother    Breast cancer Mother    High blood pressure (Hypertension) Father    Coronary Artery Disease (Blocked arteries around heart) Father        Social History   Tobacco Use Smoking Status Current Every Day Smoker Smokeless Tobacco Never Used     Social History Social History    Socioeconomic History  Marital status: Married Tobacco Use  Smoking status: Current Every Day Smoker  Smokeless tobacco: Never Used Surveyor, mining Use: Never used Substance and Sexual Activity  Alcohol use: Never  Drug use: Never      Objective:     There were no  vitals filed for this visit.  There is no height or weight on file to calculate BMI.   Physical Exam Constitutional:      Appearance: Normal appearance.  HENT:     Head: Normocephalic and atraumatic.     Nose: Nose normal. No congestion.     Mouth/Throat:     Mouth: Mucous membranes are moist.     Pharynx: Oropharynx is clear.  Eyes:     Pupils: Pupils are equal, round, and reactive to light.  Cardiovascular:     Rate and Rhythm: Normal rate and regular rhythm.     Pulses: Normal pulses.     Heart sounds: Normal heart sounds. No murmur heard.   No friction rub. No gallop.  Pulmonary:     Effort: Pulmonary effort is normal. No respiratory distress.     Breath  sounds: Normal breath sounds. No stridor. No wheezing, rhonchi or rales.  Abdominal:     General: Abdomen is flat.    Musculoskeletal:        General: Normal range of motion.     Cervical back: Normal range of motion.  Skin:    General: Skin is warm and dry.  Neurological:     General: No focal deficit present.     Mental Status: He is alert and oriented to person, place, and time.  Psychiatric:        Mood and Affect: Mood normal.        Thought Content: Thought content normal.        Assessment and Plan: Diagnoses and all orders for this visit:   Incisional hernia without obstruction or gangrene     Darin Chaney is a 66 y.o. male    1.  We will proceed to the OR for a lap ventral hernia repair with mesh, lysis of adhesions. 2. All risks and benefits were discussed with the patient, to generally include infection, bleeding, damage to surrounding structures, acute and chronic nerve pain, and recurrence. Alternatives were offered and described.  All questions were answered and the patient voiced understanding of the procedure and wishes to proceed at this point.             No follow-ups on file.   Darin Ok, MD, [facility], [facility]

## 2021-05-27 NOTE — Progress Notes (Signed)
patient voiced understanding of new arrival time of 0530 tomorrow, ERAS until 0430

## 2021-05-28 ENCOUNTER — Observation Stay (HOSPITAL_COMMUNITY)
Admission: RE | Admit: 2021-05-28 | Discharge: 2021-05-29 | Disposition: A | Discharge status: Home / Self Care | Payer: BC Managed Care – PPO | Source: Ambulatory Visit | Attending: General Surgery | Admitting: General Surgery

## 2021-05-28 ENCOUNTER — Other Ambulatory Visit: Payer: Self-pay

## 2021-05-28 ENCOUNTER — Ambulatory Visit (HOSPITAL_COMMUNITY): Payer: BC Managed Care – PPO | Admitting: Vascular Surgery

## 2021-05-28 ENCOUNTER — Encounter (HOSPITAL_COMMUNITY): Payer: Self-pay | Admitting: General Surgery

## 2021-05-28 ENCOUNTER — Ambulatory Visit (HOSPITAL_COMMUNITY): Payer: BC Managed Care – PPO | Admitting: Anesthesiology

## 2021-05-28 ENCOUNTER — Encounter (HOSPITAL_COMMUNITY)
Admission: RE | Disposition: A | Discharge status: Home / Self Care | Payer: Self-pay | Source: Ambulatory Visit | Attending: General Surgery

## 2021-05-28 DIAGNOSIS — Z8719 Personal history of other diseases of the digestive system: Secondary | ICD-10-CM

## 2021-05-28 DIAGNOSIS — E119 Type 2 diabetes mellitus without complications: Secondary | ICD-10-CM | POA: Insufficient documentation

## 2021-05-28 DIAGNOSIS — Z79899 Other long term (current) drug therapy: Secondary | ICD-10-CM | POA: Insufficient documentation

## 2021-05-28 DIAGNOSIS — K430 Incisional hernia with obstruction, without gangrene: Secondary | ICD-10-CM | POA: Diagnosis not present

## 2021-05-28 DIAGNOSIS — K432 Incisional hernia without obstruction or gangrene: Secondary | ICD-10-CM | POA: Diagnosis not present

## 2021-05-28 DIAGNOSIS — F17200 Nicotine dependence, unspecified, uncomplicated: Secondary | ICD-10-CM | POA: Insufficient documentation

## 2021-05-28 DIAGNOSIS — Z7984 Long term (current) use of oral hypoglycemic drugs: Secondary | ICD-10-CM | POA: Diagnosis not present

## 2021-05-28 DIAGNOSIS — K660 Peritoneal adhesions (postprocedural) (postinfection): Secondary | ICD-10-CM | POA: Diagnosis not present

## 2021-05-28 DIAGNOSIS — Z9889 Other specified postprocedural states: Secondary | ICD-10-CM

## 2021-05-28 HISTORY — PX: LYSIS OF ADHESION: SHX5961

## 2021-05-28 HISTORY — PX: INSERTION OF MESH: SHX5868

## 2021-05-28 HISTORY — PX: VENTRAL HERNIA REPAIR: SHX424

## 2021-05-28 LAB — GLUCOSE, CAPILLARY
Glucose-Capillary: 162 mg/dL — ABNORMAL HIGH (ref 70–99)
Glucose-Capillary: 309 mg/dL — ABNORMAL HIGH (ref 70–99)
Glucose-Capillary: 329 mg/dL — ABNORMAL HIGH (ref 70–99)
Glucose-Capillary: 344 mg/dL — ABNORMAL HIGH (ref 70–99)
Glucose-Capillary: 363 mg/dL — ABNORMAL HIGH (ref 70–99)
Glucose-Capillary: 332 mg/dL — ABNORMAL HIGH (ref 70–99)
Glucose-Capillary: 381 mg/dL — ABNORMAL HIGH (ref 70–99)
Glucose-Capillary: 292 mg/dL — ABNORMAL HIGH (ref 70–99)
Glucose-Capillary: 263 mg/dL — ABNORMAL HIGH (ref 70–99)

## 2021-05-28 SURGERY — REPAIR, HERNIA, VENTRAL, LAPAROSCOPIC
Anesthesia: General | Site: Abdomen

## 2021-05-28 MED ORDER — DEXAMETHASONE SODIUM PHOSPHATE 10 MG/ML IJ SOLN
INTRAMUSCULAR | Status: AC
  Filled 2021-05-28: qty 1

## 2021-05-28 MED ORDER — PROMETHAZINE HCL 25 MG/ML IJ SOLN
6.2500 mg | INTRAMUSCULAR | Status: DC | PRN
  Administered 2021-05-28: 6.25 mg via INTRAVENOUS

## 2021-05-28 MED ORDER — ONDANSETRON HCL 4 MG/2ML IJ SOLN: 4.0000 mg | Freq: Four times a day (QID) | INTRAMUSCULAR | Status: DC | PRN

## 2021-05-28 MED ORDER — FENTANYL CITRATE (PF) 250 MCG/5ML IJ SOLN
INTRAMUSCULAR | Status: DC | PRN
  Administered 2021-05-28: 150 ug via INTRAVENOUS
  Administered 2021-05-28: 100 ug via INTRAVENOUS

## 2021-05-28 MED ORDER — TRAMADOL HCL 50 MG PO TABS
50.0000 mg | ORAL_TABLET | Freq: Four times a day (QID) | ORAL | Status: DC | PRN
  Administered 2021-05-28 – 2021-05-29 (×3): 50 mg via ORAL
  Filled 2021-05-28 (×3): qty 1

## 2021-05-28 MED ORDER — LIDOCAINE 2% (20 MG/ML) 5 ML SYRINGE
INTRAMUSCULAR | Status: DC | PRN
  Administered 2021-05-28: 60 mg via INTRAVENOUS

## 2021-05-28 MED ORDER — FENTANYL CITRATE (PF) 100 MCG/2ML IJ SOLN
INTRAMUSCULAR | Status: AC
  Filled 2021-05-28: qty 2

## 2021-05-28 MED ORDER — ROCURONIUM BROMIDE 10 MG/ML (PF) SYRINGE
PREFILLED_SYRINGE | INTRAVENOUS | Status: DC | PRN
Start: 2021-05-28 — End: 2021-05-28
  Administered 2021-05-28: 20 mg via INTRAVENOUS
  Administered 2021-05-28: 70 mg via INTRAVENOUS

## 2021-05-28 MED ORDER — INSULIN ASPART 100 UNIT/ML IJ SOLN
INTRAMUSCULAR | Status: AC
  Filled 2021-05-28: qty 1

## 2021-05-28 MED ORDER — INSULIN ASPART 100 UNIT/ML IJ SOLN
14.0000 [IU] | Freq: Once | INTRAMUSCULAR | Status: AC
  Administered 2021-05-28: 14 [IU] via SUBCUTANEOUS

## 2021-05-28 MED ORDER — PROPOFOL 10 MG/ML IV BOLUS
INTRAVENOUS | Status: DC | PRN
  Administered 2021-05-28: 200 mg via INTRAVENOUS

## 2021-05-28 MED ORDER — INSULIN ASPART 100 UNIT/ML IJ SOLN
10.0000 [IU] | Freq: Once | INTRAMUSCULAR | Status: AC
  Administered 2021-05-28: 10 [IU] via SUBCUTANEOUS

## 2021-05-28 MED ORDER — LACTATED RINGERS IV SOLN: INTRAVENOUS | Status: DC

## 2021-05-28 MED ORDER — CHLORHEXIDINE GLUCONATE CLOTH 2 % EX PADS: 6.0000 | MEDICATED_PAD | Freq: Once | CUTANEOUS | Status: DC

## 2021-05-28 MED ORDER — HYDROMORPHONE HCL 1 MG/ML IJ SOLN
0.5000 mg | INTRAMUSCULAR | Status: DC | PRN
  Administered 2021-05-28 – 2021-05-29 (×5): 0.5 mg via INTRAVENOUS
  Filled 2021-05-28 (×5): qty 1

## 2021-05-28 MED ORDER — DEXMEDETOMIDINE (PRECEDEX) IN NS 20 MCG/5ML (4 MCG/ML) IV SYRINGE
PREFILLED_SYRINGE | INTRAVENOUS | Status: DC | PRN
  Administered 2021-05-28: 8 ug via INTRAVENOUS

## 2021-05-28 MED ORDER — IPRATROPIUM-ALBUTEROL 0.5-2.5 (3) MG/3ML IN SOLN
3.0000 mL | Freq: Once | RESPIRATORY_TRACT | Status: AC
  Administered 2021-05-28: 3 mL via RESPIRATORY_TRACT

## 2021-05-28 MED ORDER — PHENYLEPHRINE HCL-NACL 20-0.9 MG/250ML-% IV SOLN
INTRAVENOUS | Status: DC | PRN
  Administered 2021-05-28: 20 ug/min via INTRAVENOUS

## 2021-05-28 MED ORDER — BUPROPION HCL ER (XL) 150 MG PO TB24
150.0000 mg | ORAL_TABLET | Freq: Every day | ORAL | Status: DC
Start: 2021-05-29 — End: 2021-05-29
  Administered 2021-05-29: 150 mg via ORAL
  Filled 2021-05-28: qty 1

## 2021-05-28 MED ORDER — ONDANSETRON HCL 4 MG/2ML IJ SOLN
INTRAMUSCULAR | Status: DC | PRN
  Administered 2021-05-28: 4 mg via INTRAVENOUS

## 2021-05-28 MED ORDER — DEXAMETHASONE SODIUM PHOSPHATE 10 MG/ML IJ SOLN
INTRAMUSCULAR | Status: DC | PRN
Start: 2021-05-28 — End: 2021-05-28
  Administered 2021-05-28: 10 mg via INTRAVENOUS

## 2021-05-28 MED ORDER — 0.9 % SODIUM CHLORIDE (POUR BTL) OPTIME
TOPICAL | Status: DC | PRN
Start: 2021-05-28 — End: 2021-05-28
  Administered 2021-05-28: 1000 mL

## 2021-05-28 MED ORDER — CHLORHEXIDINE GLUCONATE CLOTH 2 % EX PADS
6.0000 | MEDICATED_PAD | Freq: Once | CUTANEOUS | Status: DC
Start: 2021-05-28 — End: 2021-05-28

## 2021-05-28 MED ORDER — DILTIAZEM HCL ER COATED BEADS 120 MG PO CP24
120.0000 mg | ORAL_CAPSULE | Freq: Every day | ORAL | Status: DC
  Administered 2021-05-29: 120 mg via ORAL
  Filled 2021-05-28 (×2): qty 1

## 2021-05-28 MED ORDER — FENTANYL CITRATE (PF) 100 MCG/2ML IJ SOLN
25.0000 ug | INTRAMUSCULAR | Status: AC | PRN
  Administered 2021-05-28 (×6): 25 ug via INTRAVENOUS

## 2021-05-28 MED ORDER — ESCITALOPRAM OXALATE 10 MG PO TABS
10.0000 mg | ORAL_TABLET | Freq: Every day | ORAL | Status: DC
  Administered 2021-05-29: 10 mg via ORAL
  Filled 2021-05-28: qty 1

## 2021-05-28 MED ORDER — ONDANSETRON HCL 4 MG/2ML IJ SOLN
INTRAMUSCULAR | Status: AC
  Filled 2021-05-28: qty 2

## 2021-05-28 MED ORDER — BUPIVACAINE HCL (PF) 0.25 % IJ SOLN
INTRAMUSCULAR | Status: AC
  Filled 2021-05-28: qty 30

## 2021-05-28 MED ORDER — PROMETHAZINE HCL 25 MG/ML IJ SOLN
INTRAMUSCULAR | Status: AC
  Filled 2021-05-28: qty 1

## 2021-05-28 MED ORDER — HYDRALAZINE HCL 20 MG/ML IJ SOLN: 10.0000 mg | INTRAMUSCULAR | Status: DC | PRN

## 2021-05-28 MED ORDER — HYDROMORPHONE HCL 1 MG/ML IJ SOLN
INTRAMUSCULAR | Status: AC
  Filled 2021-05-28: qty 1

## 2021-05-28 MED ORDER — FENTANYL CITRATE (PF) 250 MCG/5ML IJ SOLN
INTRAMUSCULAR | Status: AC
  Filled 2021-05-28: qty 5

## 2021-05-28 MED ORDER — ROCURONIUM BROMIDE 10 MG/ML (PF) SYRINGE
PREFILLED_SYRINGE | INTRAVENOUS | Status: AC
  Filled 2021-05-28: qty 10

## 2021-05-28 MED ORDER — LIDOCAINE 2% (20 MG/ML) 5 ML SYRINGE
INTRAMUSCULAR | Status: AC
  Filled 2021-05-28: qty 5

## 2021-05-28 MED ORDER — SODIUM CHLORIDE 0.9 % IR SOLN
Status: DC | PRN
Start: 2021-05-28 — End: 2021-05-28
  Administered 2021-05-28: 1000 mL

## 2021-05-28 MED ORDER — SUGAMMADEX SODIUM 200 MG/2ML IV SOLN
INTRAVENOUS | Status: DC | PRN
  Administered 2021-05-28: 480.8 mg via INTRAVENOUS

## 2021-05-28 MED ORDER — HYDROMORPHONE HCL 1 MG/ML IJ SOLN
0.5000 mg | Freq: Once | INTRAMUSCULAR | Status: AC
  Administered 2021-05-28: 0.5 mg via INTRAVENOUS

## 2021-05-28 MED ORDER — IPRATROPIUM-ALBUTEROL 0.5-2.5 (3) MG/3ML IN SOLN
RESPIRATORY_TRACT | Status: AC
  Filled 2021-05-28: qty 3

## 2021-05-28 MED ORDER — CHLORHEXIDINE GLUCONATE 0.12 % MT SOLN
OROMUCOSAL | Status: AC
  Administered 2021-05-28: 15 mL
  Filled 2021-05-28: qty 15

## 2021-05-28 MED ORDER — TRAMADOL HCL 50 MG PO TABS
ORAL_TABLET | ORAL | Status: AC
  Filled 2021-05-28: qty 1

## 2021-05-28 MED ORDER — BUPIVACAINE HCL 0.25 % IJ SOLN
INTRAMUSCULAR | Status: DC | PRN
  Administered 2021-05-28: 13 mL

## 2021-05-28 MED ORDER — METFORMIN HCL 500 MG PO TABS
500.0000 mg | ORAL_TABLET | Freq: Two times a day (BID) | ORAL | Status: DC
Start: 2021-05-29 — End: 2021-05-29
  Administered 2021-05-29: 500 mg via ORAL
  Filled 2021-05-28: qty 1

## 2021-05-28 MED ORDER — ACETAMINOPHEN 500 MG PO TABS
1000.0000 mg | ORAL_TABLET | ORAL | Status: AC
  Administered 2021-05-28: 1000 mg via ORAL
  Filled 2021-05-28: qty 2

## 2021-05-28 MED ORDER — TRAMADOL HCL 50 MG PO TABS: 50.0000 mg | ORAL_TABLET | Freq: Four times a day (QID) | ORAL | 0 refills | Status: AC | PRN

## 2021-05-28 MED ORDER — INSULIN ASPART 100 UNIT/ML IJ SOLN: 5.0000 [IU] | Freq: Once | INTRAMUSCULAR | Status: DC

## 2021-05-28 MED ORDER — LEVOTHYROXINE SODIUM 100 MCG PO TABS
100.0000 ug | ORAL_TABLET | Freq: Every day | ORAL | Status: DC
  Administered 2021-05-29: 100 ug via ORAL
  Filled 2021-05-28: qty 1

## 2021-05-28 MED ORDER — ONDANSETRON 4 MG PO TBDP: 4.0000 mg | ORAL_TABLET | Freq: Four times a day (QID) | ORAL | Status: DC | PRN

## 2021-05-28 MED ORDER — VANCOMYCIN HCL 1500 MG/300ML IV SOLN
1500.0000 mg | INTRAVENOUS | Status: AC
  Administered 2021-05-28: 1500 mg via INTRAVENOUS
  Filled 2021-05-28: qty 300

## 2021-05-28 MED ORDER — ZOLPIDEM TARTRATE 5 MG PO TABS
5.0000 mg | ORAL_TABLET | Freq: Every evening | ORAL | Status: DC | PRN
  Filled 2021-05-28: qty 1

## 2021-05-28 SURGICAL SUPPLY — 48 items
APPLIER CLIP 5 13 M/L LIGAMAX5 (MISCELLANEOUS)
BAG COUNTER SPONGE SURGICOUNT (BAG) ×2 IMPLANT
CANISTER SUCT 3000ML PPV (MISCELLANEOUS) IMPLANT
CHLORAPREP W/TINT 26 (MISCELLANEOUS) ×2 IMPLANT
CLIP APPLIE 5 13 M/L LIGAMAX5 (MISCELLANEOUS) IMPLANT
COVER SURGICAL LIGHT HANDLE (MISCELLANEOUS) ×2 IMPLANT
DERMABOND ADVANCED (GAUZE/BANDAGES/DRESSINGS) ×1
DERMABOND ADVANCED .7 DNX12 (GAUZE/BANDAGES/DRESSINGS) ×1 IMPLANT
DEVICE SECURE STRAP 25 ABSORB (INSTRUMENTS) ×3 IMPLANT
DEVICE TROCAR PUNCTURE CLOSURE (ENDOMECHANICALS) ×2 IMPLANT
ELECT REM PT RETURN 9FT ADLT (ELECTROSURGICAL) ×2
ELECTRODE REM PT RTRN 9FT ADLT (ELECTROSURGICAL) ×1 IMPLANT
GLOVE SURG ENC MOIS LTX SZ7.5 (GLOVE) ×1 IMPLANT
GLOVE SURG SYN 7.5  E (GLOVE) ×2
GLOVE SURG SYN 7.5 E (GLOVE) ×1 IMPLANT
GLOVE SURG SYN 7.5 PF PI (GLOVE) IMPLANT
GOWN STRL REUS W/ TWL LRG LVL3 (GOWN DISPOSABLE) ×2 IMPLANT
GOWN STRL REUS W/ TWL XL LVL3 (GOWN DISPOSABLE) ×1 IMPLANT
GOWN STRL REUS W/TWL LRG LVL3 (GOWN DISPOSABLE) ×4
GOWN STRL REUS W/TWL XL LVL3 (GOWN DISPOSABLE) ×4
KIT BASIN OR (CUSTOM PROCEDURE TRAY) ×2 IMPLANT
KIT TURNOVER KIT B (KITS) ×2 IMPLANT
MESH VENTRALIGHT ST 7X9N (Mesh General) ×1 IMPLANT
NDL INSUFFLATION 14GA 120MM (NEEDLE) ×1 IMPLANT
NEEDLE INSUFFLATION 14GA 120MM (NEEDLE) ×2 IMPLANT
NS IRRIG 1000ML POUR BTL (IV SOLUTION) ×2 IMPLANT
PAD ARMBOARD 7.5X6 YLW CONV (MISCELLANEOUS) ×4 IMPLANT
POUCH LAPAROSCOPIC INSTRUMENT (MISCELLANEOUS) ×2 IMPLANT
SCISSORS LAP 5X35 DISP (ENDOMECHANICALS) ×2 IMPLANT
SET IRRIG TUBING LAPAROSCOPIC (IRRIGATION / IRRIGATOR) ×1 IMPLANT
SET TUBE SMOKE EVAC HIGH FLOW (TUBING) ×2 IMPLANT
SHEARS HARMONIC ACE PLUS 36CM (ENDOMECHANICALS) IMPLANT
SLEEVE ENDOPATH XCEL 5M (ENDOMECHANICALS) ×5 IMPLANT
SUT CHROMIC 2 0 SH (SUTURE) ×2 IMPLANT
SUT ETHIBOND 0 MO6 C/R (SUTURE) IMPLANT
SUT MNCRL AB 4-0 PS2 18 (SUTURE) ×2 IMPLANT
SUT NOVA 1 T20/GS 25DT (SUTURE) IMPLANT
SUT NOVA NAB GS-21 0 18 T12 DT (SUTURE) ×1 IMPLANT
SUT PROLENE 2 0 KS (SUTURE) ×2 IMPLANT
TOWEL GREEN STERILE (TOWEL DISPOSABLE) ×1 IMPLANT
TOWEL GREEN STERILE FF (TOWEL DISPOSABLE) ×2 IMPLANT
TRAY FOLEY W/BAG SLVR 16FR (SET/KITS/TRAYS/PACK)
TRAY FOLEY W/BAG SLVR 16FR ST (SET/KITS/TRAYS/PACK) IMPLANT
TRAY LAPAROSCOPIC MC (CUSTOM PROCEDURE TRAY) ×2 IMPLANT
TROCAR XCEL NON-BLD 11X100MML (ENDOMECHANICALS) IMPLANT
TROCAR XCEL NON-BLD 5MMX100MML (ENDOMECHANICALS) ×2 IMPLANT
WARMER LAPAROSCOPE (MISCELLANEOUS) ×2 IMPLANT
WATER STERILE IRR 1000ML POUR (IV SOLUTION) ×1 IMPLANT

## 2021-05-28 NOTE — Progress Notes (Signed)
Inpatient Diabetes Program Recommendations  AACE/ADA: New Consensus Statement on Inpatient Glycemic Control (2015)  Target Ranges:  Prepandial:   less than 140 mg/dL      Peak postprandial:   less than 180 mg/dL (1-2 hours)      Critically ill patients:  140 - 180 mg/dL   Lab Results  Component Value Date   GLUCAP 292 (H) 05/28/2021   HGBA1C 7.4 (H) 05/20/2021    Review of Glycemic Control  Latest Reference Range & Units 05/28/21 05:54 05/28/21 09:24 05/28/21 10:03 05/28/21 10:25 05/28/21 11:08  Glucose-Capillary 70 - 99 mg/dL 162 (H) 309 (H) 329 (H) 344 (H) 363 (H)   Diabetes history: DM 2 Outpatient Diabetes medications:  Metformin 500 mg bid Current orders for Inpatient glycemic control:  None Note that patient received Novolog 10 units at 0943, Novolog 10 units at 1010, Novolog 14 units at 1034, and Novolog 14 units at 1147.   Inpatient Diabetes Program Recommendations:    Patient received Decadron 10 mg pre-op which likely increased blood sugars. A1C=7.4% indicating fairly good control prior to admit.   Recommend Novolog sensitive tid with meals and HS while overnight in the hospital.    Thanks,  Adah Perl, RN, BC-ADM Inpatient Diabetes Coordinator Pager [phone]

## 2021-05-28 NOTE — Interval H&P Note (Signed)
History and Physical Interval Note:  05/28/2021 7:10 AM  Darin Chaney  has presented today for surgery, with the diagnosis of INCISIONAL HERNIA.  The various methods of treatment have been discussed with the patient and family. After consideration of risks, benefits and other options for treatment, the patient has consented to  Procedure(s): [place] (N/A) LYSIS OF ADHESION (N/A) as a surgical intervention.  The patient's history has been reviewed, patient examined, no change in status, stable for surgery.  I have reviewed the patient's chart and labs.  Questions were answered to the patient's satisfaction.     Ralene Ok

## 2021-05-28 NOTE — Discharge Instructions (Signed)
[facility] _______[facility]  HERNIA REPAIR: POST OP INSTRUCTIONS  Always review your discharge instruction sheet given to you by the facility where your surgery was performed. IF YOU HAVE DISABILITY OR FAMILY LEAVE FORMS, YOU MUST BRING THEM TO THE OFFICE FOR PROCESSING.   DO NOT GIVE THEM TO YOUR DOCTOR.  1. A  prescription for pain medication may be given to you upon discharge.  Take your pain medication as prescribed, if needed.  If narcotic pain medicine is not needed, then you may take acetaminophen (Tylenol) or ibuprofen (Advil) as needed. 2. Take your usually prescribed medications unless otherwise directed. If you need a refill on your pain medication, please contact your pharmacy.  They will contact our office to request authorization. Prescriptions will not be filled after 5 pm or on week-ends. 3. You should follow a light diet the first 24 hours after arrival home, such as soup and crackers, etc.  Be sure to include lots of fluids daily.  Resume your normal diet the day after surgery. 4.Most patients will experience some swelling and bruising around the umbilicus or in the groin and scrotum.  Ice packs and reclining will help.  Swelling and bruising can take several days to resolve.  6. It is common to experience some constipation if taking pain medication after surgery.  Increasing fluid intake and taking a stool softener (such as Colace) will usually help or prevent this problem from occurring.  A mild laxative (Milk of Magnesia or Miralax) should be taken according to package directions if there are no bowel movements after 48 hours. 7. Unless discharge instructions indicate otherwise, you may remove your bandages 24-48 hours after surgery, and you may shower at that time.  You may have steri-strips (small skin tapes) in place directly over the incision.  These strips should be left on the skin for 7-10 days.  If your surgeon used skin glue on the incision, you may shower in  24 hours.  The glue will flake off over the next 2-3 weeks.  Any sutures or staples will be removed at the office during your follow-up visit. 8. ACTIVITIES:  You may resume regular (light) daily activities beginning the next day--such as daily self-care, walking, climbing stairs--gradually increasing activities as tolerated.  You may have sexual intercourse when it is comfortable.  Refrain from any heavy lifting or straining until approved by your doctor.  a.You may drive when you are no longer taking prescription pain medication, you can comfortably wear a seatbelt, and you can safely maneuver your car and apply brakes. b.RETURN TO WORK:   _____________________________________________  9.You should see your doctor in the office for a follow-up appointment approximately 2-3 weeks after your surgery.  Make sure that you call for this appointment within a day or two after you arrive home to insure a convenient appointment time. 10.OTHER INSTRUCTIONS: _________________________    _____________________________________  WHEN TO CALL YOUR DOCTOR: Fever over 101.0 Inability to urinate Nausea and/or vomiting Extreme swelling or bruising Continued bleeding from incision. Increased pain, redness, or drainage from the incision  The clinic staff is available to answer your questions during regular business hours.  Please don't hesitate to call and ask to speak to one of the nurses for clinical concerns.  If you have a medical emergency, go to the nearest emergency room or call 911.  A surgeon from [facility] is always on call at the hospital   [address], [address], [place], [place]    [address] ?  [address] [address], [place], [place]   [address] [phone] ? [phone] ? FAX [phone] Web site: [email or web address]  

## 2021-05-28 NOTE — Progress Notes (Signed)
Called by PACU RN about patient's status.  Pt has had a one bout of tachycardia and dyspnea in PACU.  Was about to DC home and had another episode.  Was felt safer per anesthesia to admit overnight and watch.  Will admit him to Med Tele for obs tonight.  If OK hopefully home in AM.

## 2021-05-28 NOTE — Anesthesia Procedure Notes (Addendum)
Procedure Name: Intubation Date/Time: 05/28/2021 7:45 AM Performed by: Minerva Ends, CRNA Pre-anesthesia Checklist: Patient identified, Emergency Drugs available, Suction available and Patient being monitored Patient Re-evaluated:Patient Re-evaluated prior to induction Oxygen Delivery Method: Circle system utilized Preoxygenation: Pre-oxygenation with 100% oxygen Induction Type: IV induction Ventilation: Two handed mask ventilation required and Mask ventilation with difficulty Laryngoscope Size: Mac, 3 and Glidescope Grade View: Grade II Tube type: Oral Tube size: 7.0 mm Number of attempts: 2 Airway Equipment and Method: Stylet and Oral airway Placement Confirmation: ETT inserted through vocal cords under direct vision, positive ETCO2 and breath sounds checked- equal and bilateral Secured at: 23 cm Tube secured with: Tape Dental Injury: Teeth and Oropharynx as per pre-operative assessment  Comments: Attempt 1- DL grade 3 view, unable to pass ETT Attempt 2- glidescope grade 2 view successful

## 2021-05-28 NOTE — Op Note (Signed)
05/28/2021  9:05 AM  PATIENT:  Darin Chaney  66 y.o. male  PRE-OPERATIVE DIAGNOSIS:  INCISIONAL HERNIA  POST-OPERATIVE DIAGNOSIS:  INCARCERATED INCISIONAL HERNIA WITH SWISS CHEESE DEFECTS  PROCEDURE:  Procedure(s): LAPAROSCOPIC VENTRAL HERNIA REPAIR WITH MESH (N/A) LYSIS OF ADHESION 30MIN (N/A) INSERTION OF MESH (N/A)  SURGEON:  Surgeon(s) and Role:    * Ralene Ok, MD - Primary  ASSISTANTS: Radonna Ricker, MD, PGY-5   ANESTHESIA:   local and general  EBL:  25cc   BLOOD ADMINISTERED:none  DRAINS: none   LOCAL MEDICATIONS USED:  BUPIVICAINE   SPECIMEN:  No Specimen  DISPOSITION OF SPECIMEN:  N/A  COUNTS:  YES  TOURNIQUET:  * No tourniquets in log *  DICTATION: .Dragon Dictation  Details of the procedure:  Findings: 16x5cm initial incarcerated hernia. 23x18cm piece of Bard ST light mesh placed.  Details of the procedure:  After the patient was consented patient was taken back to the operating room patient was then placed in supine position bilateral SCDs in place.  The patient was prepped and draped in the usual sterile fashion. After antibiotics were confirmed a timeout was called and all facts were verified. The Veress needle technique was used to insuflate the abdomen at Palmer's point. The abdomen was insufflated to 14 mm mercury. Subsequently a 5 mm trocar was placed a camera inserted there was no injury to any intra-abdominal organs.  There was significant omental adhesion to the anterior abdominal wall.  A 5 Miller trochars placed in the left lower quadrant direct visualization.  At this time I proceeded to take down the omental adhesions from the anterior abdominal wall.  This consisted mainly of omentum.  A third 5 mm trocar was placed in between these 2 trochars.  At this time I continue to reduce the incarcerated hernias along the midline incisional area.  Adhesiolysis was approximate 30 minutes.  At this time the hernia was measured from cephalad to  caudad.  This measured approximately 16 cm, and was 5 cm across.   The fascia at the hernia was reapproximated using a #1Novafil x 4.  There were some smaller Swiss cheese defects that were not reapproximated.  Once the hernia was cleared away, a Bard Ventralight 23 x 18 cm  mesh was inserted into the abdomen.  The mesh was secured circumferentially with am Securestrap tacker in a double crown fashion.  2-0 prolenes were used at 3:00, 6:00, 9:00, and 12:00 as transfascial sutures using a endoclose decive.    The omentum was brought over the area of the mesh.  There was some blood that was suctioned out of the abdominal cavity.  The left lower quadrant trocar fascia was reapproximated using 0 Vicryl and Endo Close device.  The pneumoperitoneum was evacuated  & all trocars  were removed. The skin was reapproximated with 4-0  Monocryl sutures in a subcuticular fashion. The skin was dressed with Dermabond.  The patient was taken to the recovery room in stable condition.  Type of repair -primary suture & mesh  Mesh overlap - 5cm  Placement of mesh -  beneath fascia and into peritoneal cavity  Size: >10cm Initial hernia Incarcerated  I was personally present during the key and critical portions of this procedure and immediately available throughout the entire procedure, as documented in my operative note.    PLAN OF CARE: Discharge to home after PACU  PATIENT DISPOSITION:  PACU - hemodynamically stable.   Delay start of Pharmacological VTE agent (>24hrs) due to surgical  blood loss or risk of bleeding: not applicable

## 2021-05-28 NOTE — Transfer of Care (Signed)
Immediate Anesthesia Transfer of Care Note  Patient: Darin Chaney  Procedure(s) Performed: LAPAROSCOPIC VENTRAL HERNIA REPAIR WITH MESH (Abdomen) LYSIS OF ADHESION (Abdomen) INSERTION OF MESH (Abdomen)  Patient Location: PACU  Anesthesia Type:General  Level of Consciousness: awake, alert  and oriented  Airway & Oxygen Therapy: Patient Spontanous Breathing  Post-op Assessment: Report given to RN and Post -op Vital signs reviewed and stable  Post vital signs: Reviewed and stable  Last Vitals:  Vitals Value Taken Time  BP 116/67 05/28/21 0925  Temp 36.6 C 05/28/21 0925  Pulse 83 05/28/21 0928  Resp 17 05/28/21 0928  SpO2 94 % 05/28/21 0928  Vitals shown include unvalidated device data.  Last Pain:  Vitals:   05/28/21 0629  TempSrc:   PainSc: 0-No pain      Patients Stated Pain Goal: 0 (41/93/79 0240)  Complications: No notable events documented.

## 2021-05-28 NOTE — Progress Notes (Signed)
I called Dr. Gloris Manchester regarding patient's CBG of 309. He verbal ordered 10 units insulin SQ. I administered. Approximately 15 minutes later I checked his CBG, it had increased to 329. I informed Dr. Gloris Manchester, who gave another verbal order for 10 units SQ insulin. I administered and checked CBG approx. 15 minutes later. It had increased to 344. Dr. Gloris Manchester ordered 14 units SQ insulin, which I administered after conferring with charge RN. Dr. Gloris Manchester also wanted to wait 30 minutes before checking again. I did so, and it was 363. We attempted to get the patient up into a recliner, he became tachycardic in the 120s, became [facility] with gasping and stridor, nauseous and diaphoretic. We got the patient back in the bed on a simple mask, he returned to baseline after approx 5-10 min. I called Dr. Gloris Manchester, who came to the bedside. He ordered 0.5mg  dilaudid,  5 units IV push of insulin( which we found out from pharmacy was not done unless it was for high potassium, then changed the order to another 14 units insulin SQ and 30 min wait before recheck), and an EKG. I administered 14 units insulin SQ. CBG was 381 then came down to 332. EKG was performed. I spoke with Dr. Emogene Morgan and updated him. I also updated patient's wife.   I handed Dr. Gloris Manchester the EKG. With Dr. Gloris Manchester at bedside, we sat patient up to try to move to recliner again. Patient became tachycardic again to the 120s, SHOB with gasping and stridor, nausea and diaphoretic. We got the patient back in the bed. He ordered Duoneb. I administered. Patient looked more comfortable. CBG was 292. I called Dr. Gloris Manchester and informed him. He said to get try and get the patient to a recliner again. We did so, without difficulty. He was comfortable in the recliner, Dr. Gloris Manchester came to bedside and said patient was clear for discharge. We stood patient up and were putting his clothes on when he once again became tachycardic in the 120s, SHOB with gasping and stridor,  nausea and diaphoretic. We sat him back in the recliner with simple mask and he returned to baseline after approx 10-22min. I called Dr. Gloris Manchester, who said he needed to be admitted. I spoke to Dr. Rosendo Gros, who agreed patient to be admitted. I also updated wife.  During the events, patient did not display any signs of anxiety or that it was related to pain, as patient had complained of more severe pain while laying in bed and had not had these symptoms.

## 2021-05-29 ENCOUNTER — Encounter (HOSPITAL_COMMUNITY): Payer: Self-pay | Admitting: General Surgery

## 2021-05-29 DIAGNOSIS — Z79899 Other long term (current) drug therapy: Secondary | ICD-10-CM | POA: Diagnosis not present

## 2021-05-29 DIAGNOSIS — Z7984 Long term (current) use of oral hypoglycemic drugs: Secondary | ICD-10-CM | POA: Diagnosis not present

## 2021-05-29 DIAGNOSIS — K432 Incisional hernia without obstruction or gangrene: Secondary | ICD-10-CM | POA: Diagnosis not present

## 2021-05-29 DIAGNOSIS — E119 Type 2 diabetes mellitus without complications: Secondary | ICD-10-CM | POA: Diagnosis not present

## 2021-05-29 DIAGNOSIS — F17200 Nicotine dependence, unspecified, uncomplicated: Secondary | ICD-10-CM | POA: Diagnosis not present

## 2021-05-29 DIAGNOSIS — K430 Incisional hernia with obstruction, without gangrene: Secondary | ICD-10-CM | POA: Diagnosis not present

## 2021-05-29 DIAGNOSIS — R000 Tachycardia, unspecified: Secondary | ICD-10-CM | POA: Diagnosis not present

## 2021-05-29 LAB — CBC
HCT: 33.4 % — ABNORMAL LOW (ref 39.0–52.0)
Hemoglobin: 11.5 g/dL — ABNORMAL LOW (ref 13.0–17.0)
MCH: 30.7 pg (ref 26.0–34.0)
MCHC: 34.4 g/dL (ref 30.0–36.0)
MCV: 89.3 fL (ref 80.0–100.0)
Platelets: 284 10*3/uL (ref 150–400)
RBC: 3.74 MIL/uL — ABNORMAL LOW (ref 4.22–5.81)
RDW: 12.8 % (ref 11.5–15.5)
WBC: 17.3 10*3/uL — ABNORMAL HIGH (ref 4.0–10.5)
nRBC: 0 % (ref 0.0–0.2)

## 2021-05-29 LAB — GLUCOSE, CAPILLARY: Glucose-Capillary: 207 mg/dL — ABNORMAL HIGH (ref 70–99)

## 2021-05-29 MED ORDER — OXYCODONE HCL 5 MG PO TABS
5.0000 mg | ORAL_TABLET | ORAL | Status: DC | PRN
  Administered 2021-05-29 (×2): 5 mg via ORAL
  Filled 2021-05-29 (×2): qty 1

## 2021-05-29 MED ORDER — DIAZEPAM 5 MG PO TABS
5.0000 mg | ORAL_TABLET | Freq: Four times a day (QID) | ORAL | 0 refills | Status: AC | PRN
Start: 2021-05-29 — End: ?

## 2021-05-29 MED ORDER — OXYCODONE HCL 5 MG PO TABS: 5.0000 mg | ORAL_TABLET | ORAL | 0 refills | Status: AC | PRN

## 2021-05-29 MED ORDER — DIAZEPAM 5 MG PO TABS
5.0000 mg | ORAL_TABLET | Freq: Four times a day (QID) | ORAL | Status: DC | PRN
  Administered 2021-05-29: 5 mg via ORAL
  Filled 2021-05-29: qty 1

## 2021-05-29 NOTE — Progress Notes (Signed)
1 Day Post-Op   Subjective/Chief Complaint: Pt doing well this AM Some muscle spams    Objective: Vital signs in last 24 hours: Temp:  [97.2 F (36.2 C)-98.5 F (36.9 C)] 98.5 F (36.9 C) (01/06 0457) Pulse Rate:  [81-123] 100 (01/06 0457) Resp:  [11-24] 18 (01/06 0457) BP: (100-153)/(57-96) 153/95 (01/06 0457) SpO2:  [91 %-100 %] 93 % (01/06 0457) Weight:  [123.5 kg] 123.5 kg (01/06 0306)    Intake/Output from previous day: 01/05 0701 - 01/06 0700 In: 1180 [P.O.:480; I.V.:700] Out: 570 [Urine:550; Blood:20] Intake/Output this shift: No intake/output data recorded.  General appearance: alert and cooperative GI: soft, non-tender; bowel sounds normal; no masses,  no organomegaly and inc c/d/i  Lab Results:  Recent Labs    05/29/21 0653  WBC 17.3*  HGB 11.5*  HCT 33.4*  PLT 284   BMET No results for input(s): NA, K, CL, CO2, GLUCOSE, BUN, CREATININE, CALCIUM in the last 72 hours. PT/INR No results for input(s): LABPROT, INR in the last 72 hours. ABG No results for input(s): PHART, HCO3 in the last 72 hours.  Invalid input(s): PCO2, PO2  Studies/Results: No results found.  Anti-infectives: Anti-infectives (From admission, onward)    Start     Dose/Rate Route Frequency Ordered Stop   05/28/21 0600  vancomycin (VANCOREADY) IVPB 1500 mg/300 mL        1,500 mg 150 mL/hr over 120 Minutes Intravenous On call to O.R. 05/28/21 7482 05/28/21 0842       Assessment/Plan: s/p Procedure(s): LAPAROSCOPIC VENTRAL HERNIA REPAIR WITH MESH (N/A) LYSIS OF ADHESION (N/A) INSERTION OF MESH (N/A) Discharge later today Plan for valium for muscle spasms Mobilize Ice to abd  LOS: 0 days    Darin Chaney 05/29/2021

## 2021-05-29 NOTE — Progress Notes (Signed)
DISCHARGE NOTE HOME Darin Chaney to be discharged Home per MD order. Discussed prescriptions and follow up appointments with the patient. Prescriptions given to patient; medication list explained in detail. Patient verbalized understanding.  Skin clean, dry and intact without evidence of skin break down, no evidence of skin tears noted. IV catheter discontinued intact. Site without signs and symptoms of complications. Dressing and pressure applied. Pt denies pain at the site currently. No complaints noted.  Patient free of lines, drains, and wounds.   An After Visit Summary (AVS) was printed and given to the patient. Patient escorted via wheelchair, and discharged home via private auto.  Jannice Beitzel S Scarleth Brame, RN

## 2021-05-29 NOTE — Care Management Obs Status (Signed)
[place] NOTIFICATION   Patient Details  Name: Darin Chaney MRN: [id number] Date of Birth: 1955/06/26   Medicare Observation Status Notification Given:  Yes    Tom-Johnson, Renea Ee, RN 05/29/2021, 3:20 PM

## 2021-05-29 NOTE — Anesthesia Postprocedure Evaluation (Signed)
Anesthesia Post Note  Patient: Darin Chaney  Procedure(s) Performed: LAPAROSCOPIC VENTRAL HERNIA REPAIR WITH MESH (Abdomen) LYSIS OF ADHESION (Abdomen) INSERTION OF MESH (Abdomen)     Patient location during evaluation: PACU Anesthesia Type: General Level of consciousness: awake and alert Pain management: pain level controlled Vital Signs Assessment: post-procedure vital signs reviewed and stable Respiratory status: spontaneous breathing, nonlabored ventilation, respiratory function stable and patient connected to nasal cannula oxygen Cardiovascular status: blood pressure returned to baseline and stable Postop Assessment: no apparent nausea or vomiting Anesthetic complications: no Comments: Patient with persistently elevated FSG (300s) in PACU requiring extensive sq insulin therapy to ultimately downtrend appropriately. He also had intermittent bouts of tachycardia with diaphoresis and shortness of breath. Denied chest pain, strip on monitor reassuring. 12 lead EKG obtained and unchanged from prior. Auscultation revealed rhonchi but no wheezing. Duoneb administered with improvement. Patient's tachycardia and shortness of breath presented when attempts made to move from bed to chair with improvement when attempts aborted. Given intermittent symptoms and persistently elevated FSG despite therapy, decision made to admit patient to tele for further care. Surgeon and patient family aware and in agreement with plan.     No notable events documented.  Last Vitals:  Vitals:   05/29/21 0457 05/29/21 0805  BP: (!) 153/95 (!) 142/80  Pulse: 100 (!) 103  Resp: 18 18  Temp: 36.9 C 36.7 C  SpO2: 93% 95%    Last Pain:  Vitals:   05/29/21 1453  TempSrc:   PainSc: 10-Worst pain ever                 Belenda Cruise P Steffie Waggoner

## 2021-05-29 NOTE — Progress Notes (Signed)
°  Transition of Care [facility]) Screening Note   Patient Details  Name: Darin Chaney Date of Birth: 1956/01/29   Transition of Care [facility]) CM/SW Contact:    Tom-Johnson, Renea Ee, RN Phone Number: 05/29/2021, 1:30 PM  Patient is scheduled for discharge today. No recommendations noted. Wife at bedside and will transport at discharge. Transition of Care Department [facility]) has reviewed patient and no TOC needs have been identified at this time. We will continue to monitor patient advancement through interdisciplinary progression rounds. If new patient transition needs arise, please place a TOC consult.

## 2021-06-03 NOTE — Discharge Summary (Signed)
Physician Discharge Summary  Patient ID: Darin Chaney MRN: [id number] DOB/AGE: 1955/11/10 66 y.o.  Admit date: 05/28/2021 Discharge date: 06/03/2021  Admission Diagnoses:  Discharge Diagnoses:  Principal Problem:   S/P hernia repair   Discharged Condition: good  Hospital Course: Patient was admitted postop following laparoscopic ventral hernia pair with mesh.  Please see op note for full details. Patient had some tachycardia as well as some dyspnea in the PACU.  Patient was admitted for observation.  Patient did well overnight without any issues.  Patient had good pain control.  Patient was deemed stable for discharge and discharged home.  Consults: None  Significant Diagnostic Studies: None  Treatments: surgery: As above  Discharge Exam: Blood pressure (!) 142/80, pulse (!) 103, temperature 98 F (36.7 C), temperature source Oral, resp. rate 18, height 5\' 11"  (1.803 m), weight 123.5 kg, SpO2 95 %. General appearance: alert and cooperative GI: soft, non-tender; bowel sounds normal; no masses,  no organomegaly  Disposition: Discharge disposition: 01-Home or Self Care       Discharge Instructions     Diet - low sodium heart healthy   Complete by: As directed    Increase activity slowly   Complete by: As directed       Allergies as of 05/29/2021       Reactions   Penicillins Nausea Only, Rash        Medication List     TAKE these medications    buPROPion 150 MG 24 hr tablet Commonly known as: WELLBUTRIN XL Take 150 mg by mouth daily.   diazepam 5 MG tablet Commonly known as: VALIUM Take 1 tablet (5 mg total) by mouth every 6 (six) hours as needed for muscle spasms.   diltiazem 120 MG 24 hr capsule Commonly known as: DILACOR XR 120 mg daily.   escitalopram 10 MG tablet Commonly known as: LEXAPRO Take 10 mg by mouth daily.   ibuprofen 200 MG tablet Commonly known as: ADVIL Take 1 tablet (200 mg total) by mouth every 6 (six) hours as needed.    levothyroxine 100 MCG tablet Commonly known as: SYNTHROID Take 100 mcg by mouth daily before breakfast.   metFORMIN 500 MG tablet Commonly known as: GLUCOPHAGE Take 500 mg by mouth 2 (two) times daily with a meal.   oxyCODONE 5 MG immediate release tablet Commonly known as: Oxy IR/ROXICODONE Take 1 tablet (5 mg total) by mouth every 4 (four) hours as needed for severe pain.   pravastatin 40 MG tablet Commonly known as: PRAVACHOL Take 40 mg by mouth daily.   traMADol 50 MG tablet Commonly known as: Ultram Take 1 tablet (50 mg total) by mouth every 6 (six) hours as needed.   zolpidem 10 MG tablet Commonly known as: AMBIEN Take 10 mg by mouth at bedtime as needed for sleep.        Follow-up Information     Ralene Ok, MD Follow up.   Specialty: General Surgery Contact information: [place] [id number] [phone]                 Signed: Ralene Ok 06/03/2021, 5:32 PM

## 2021-06-05 ENCOUNTER — Emergency Department (HOSPITAL_COMMUNITY)
Admission: EM | Admit: 2021-06-05 | Discharge: 2021-06-06 | Discharge status: Against Medical Advice | Payer: BC Managed Care – PPO

## 2021-06-05 ENCOUNTER — Other Ambulatory Visit: Payer: Self-pay

## 2021-06-05 NOTE — ED Notes (Signed)
Pt called for triage with no response multiple times

## 2021-06-16 DIAGNOSIS — Z8719 Personal history of other diseases of the digestive system: Secondary | ICD-10-CM | POA: Diagnosis not present

## 2021-06-16 DIAGNOSIS — Z9889 Other specified postprocedural states: Secondary | ICD-10-CM | POA: Diagnosis not present

## 2021-07-01 DIAGNOSIS — I10 Essential (primary) hypertension: Secondary | ICD-10-CM | POA: Diagnosis not present

## 2021-07-01 DIAGNOSIS — E1149 Type 2 diabetes mellitus with other diabetic neurological complication: Secondary | ICD-10-CM | POA: Diagnosis not present

## 2021-10-28 DIAGNOSIS — I10 Essential (primary) hypertension: Secondary | ICD-10-CM | POA: Diagnosis not present

## 2021-10-28 DIAGNOSIS — E1149 Type 2 diabetes mellitus with other diabetic neurological complication: Secondary | ICD-10-CM | POA: Diagnosis not present

## 2021-12-11 DIAGNOSIS — Z0000 Encounter for general adult medical examination without abnormal findings: Secondary | ICD-10-CM | POA: Diagnosis not present

## 2021-12-18 DIAGNOSIS — Z125 Encounter for screening for malignant neoplasm of prostate: Secondary | ICD-10-CM | POA: Diagnosis not present

## 2021-12-18 DIAGNOSIS — E785 Hyperlipidemia, unspecified: Secondary | ICD-10-CM | POA: Diagnosis not present

## 2021-12-18 DIAGNOSIS — R739 Hyperglycemia, unspecified: Secondary | ICD-10-CM | POA: Diagnosis not present

## 2021-12-18 DIAGNOSIS — I10 Essential (primary) hypertension: Secondary | ICD-10-CM | POA: Diagnosis not present

## 2021-12-23 DIAGNOSIS — Z1331 Encounter for screening for depression: Secondary | ICD-10-CM | POA: Diagnosis not present

## 2021-12-23 DIAGNOSIS — E1149 Type 2 diabetes mellitus with other diabetic neurological complication: Secondary | ICD-10-CM | POA: Diagnosis not present

## 2021-12-23 DIAGNOSIS — Z0000 Encounter for general adult medical examination without abnormal findings: Secondary | ICD-10-CM | POA: Diagnosis not present

## 2021-12-23 DIAGNOSIS — R82998 Other abnormal findings in urine: Secondary | ICD-10-CM | POA: Diagnosis not present

## 2021-12-23 DIAGNOSIS — Z1339 Encounter for screening examination for other mental health and behavioral disorders: Secondary | ICD-10-CM | POA: Diagnosis not present

## 2022-01-30 DIAGNOSIS — H524 Presbyopia: Secondary | ICD-10-CM | POA: Diagnosis not present

## 2022-01-30 DIAGNOSIS — H2513 Age-related nuclear cataract, bilateral: Secondary | ICD-10-CM | POA: Diagnosis not present

## 2022-01-30 DIAGNOSIS — E119 Type 2 diabetes mellitus without complications: Secondary | ICD-10-CM | POA: Diagnosis not present

## 2022-05-06 DIAGNOSIS — M170 Bilateral primary osteoarthritis of knee: Secondary | ICD-10-CM | POA: Diagnosis not present

## 2022-07-05 DIAGNOSIS — M79602 Pain in left arm: Secondary | ICD-10-CM | POA: Diagnosis not present

## 2022-07-12 DIAGNOSIS — M79602 Pain in left arm: Secondary | ICD-10-CM | POA: Diagnosis not present

## 2022-11-28 IMAGING — CT CT ABD-PELV W/O CM
1 of 2 series · 13 of 32 positions shown, 18 images · non-contrast
Comparison: None.

CLINICAL DATA: Incisional hernia. Mid abdominal pain. Personal
history of skin cancer.

EXAM:
CT ABDOMEN AND PELVIS WITHOUT CONTRAST
TECHNIQUE: Multidetector CT imaging of the abdomen and pelvis was performed
following the standard protocol without IV contrast.

[Series 2: abd/pelvis w/(date) · axial · 0.98mm/px · z∈[-526,-76]mm · 13 of 103 slices shown, 18 images]
[im 7/103  soft-tissue]
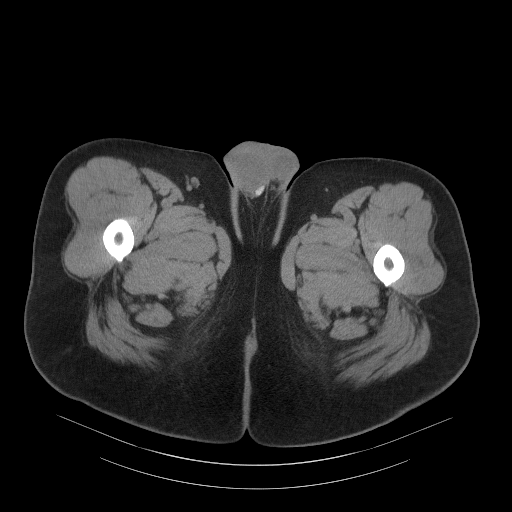
[im 7/103  bone]
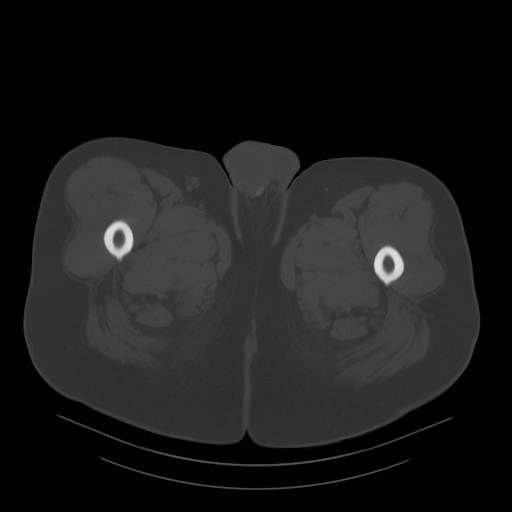
[im 19/103  soft-tissue]
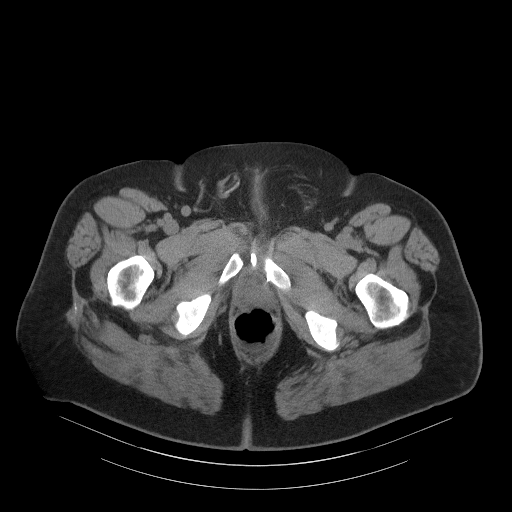
[im 25/103  soft-tissue]
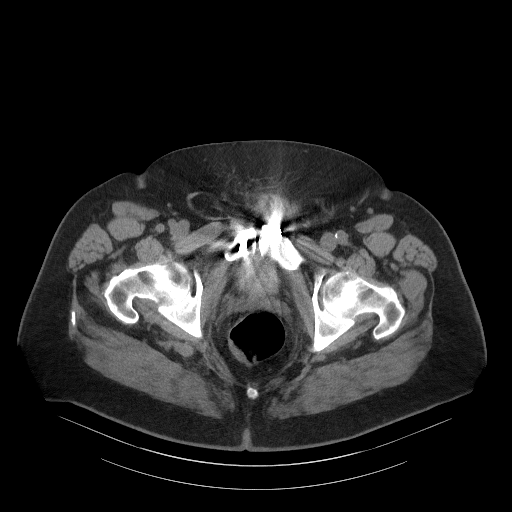
[im 31/103  soft-tissue]
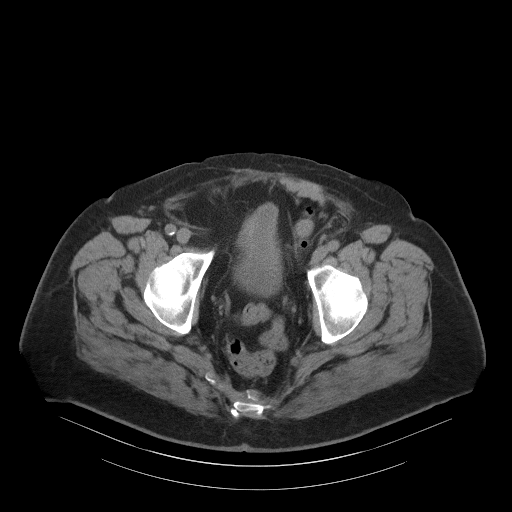
[im 43/103  soft-tissue]
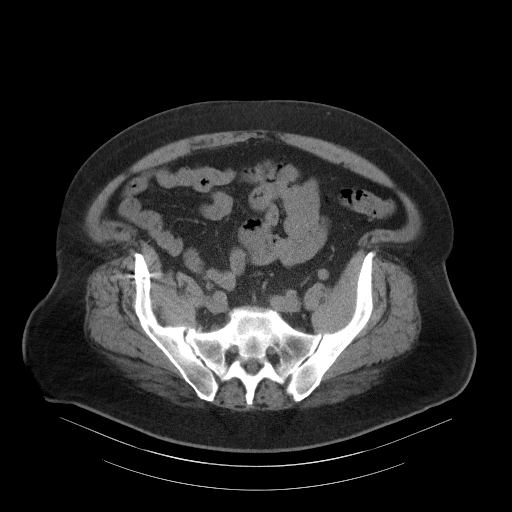
[im 49/103  soft-tissue]
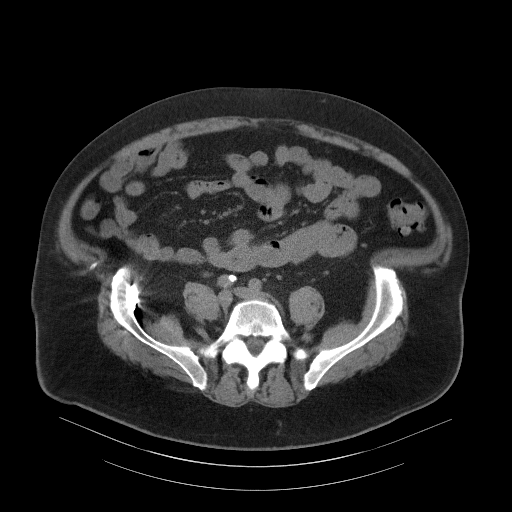
[im 55/103  soft-tissue]
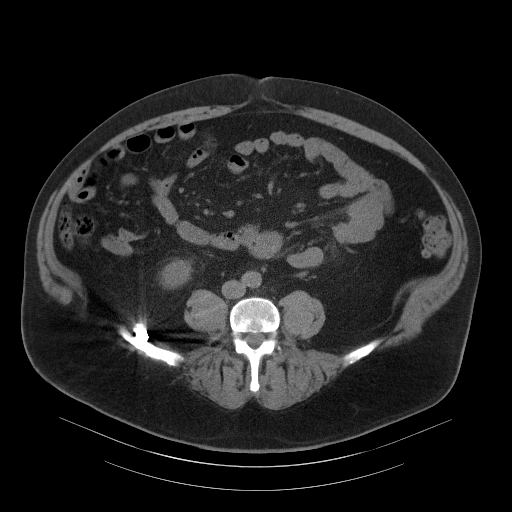
[im 67/103  soft-tissue]
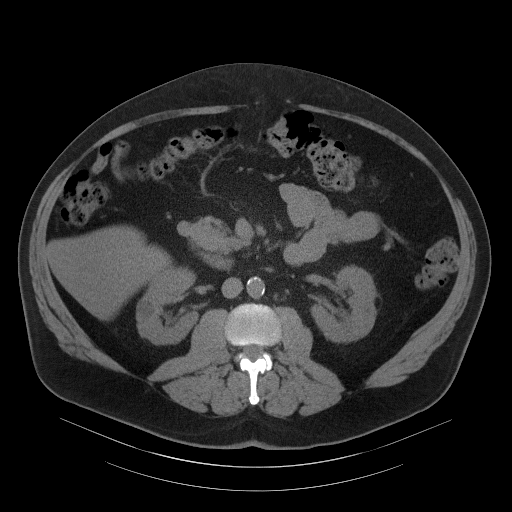
[im 73/103  soft-tissue]
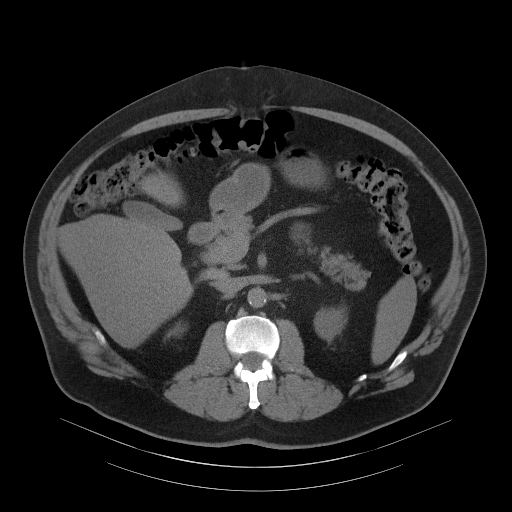
[im 73/103  bone]
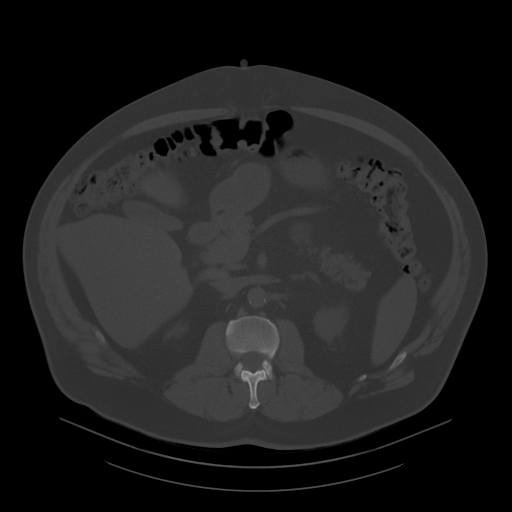
[im 79/103  soft-tissue]
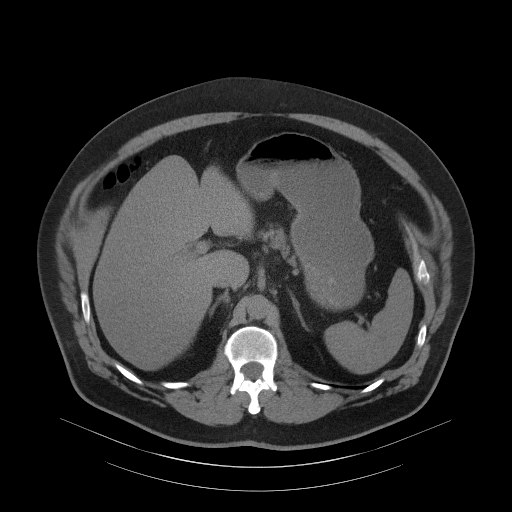
[im 79/103  lung]
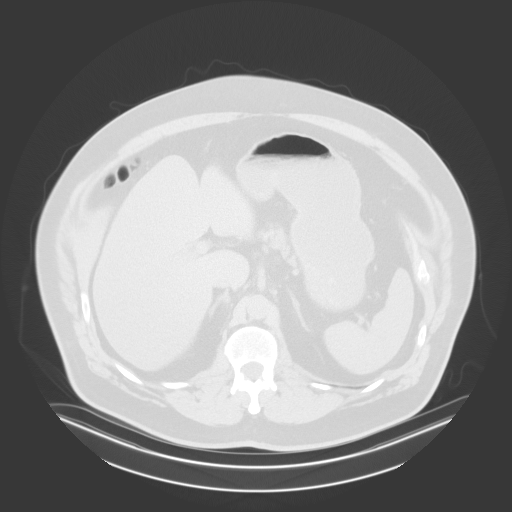
[im 85/103  lung]
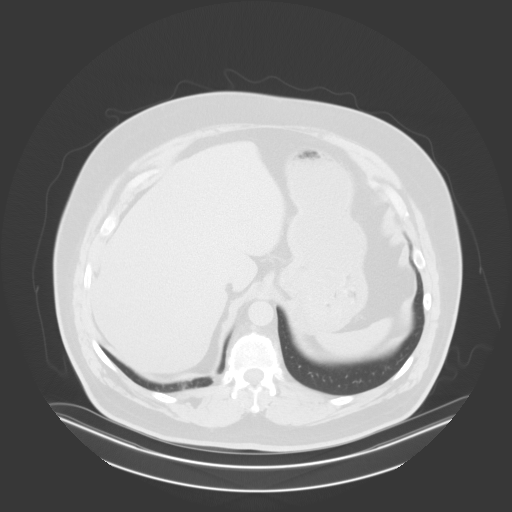
[im 91/103  soft-tissue]
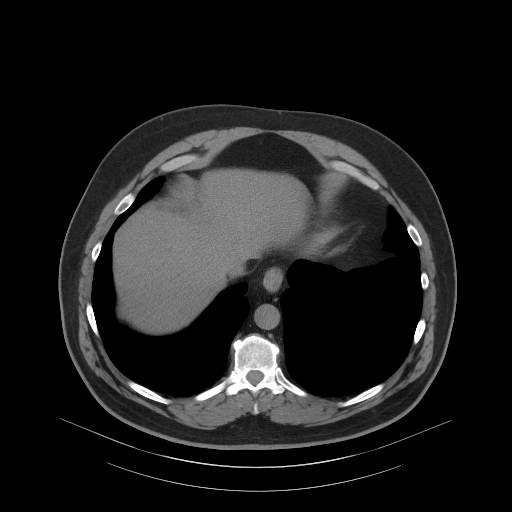
[im 91/103  lung]
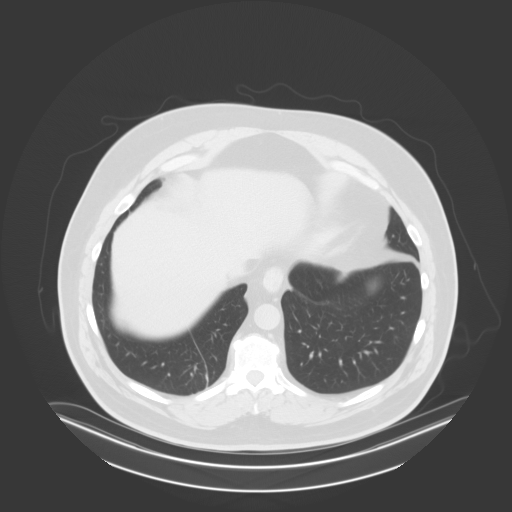
[im 97/103  soft-tissue]
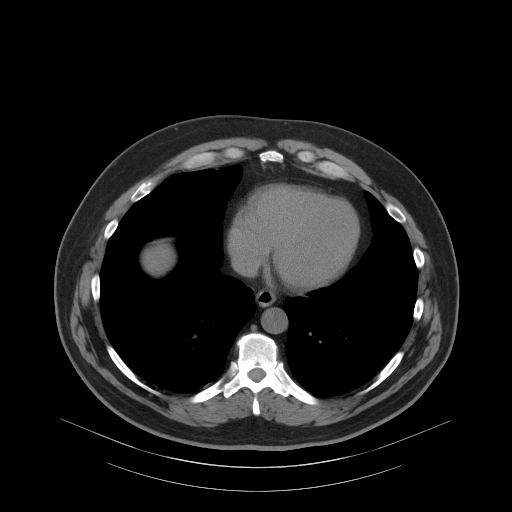
[im 97/103  lung]
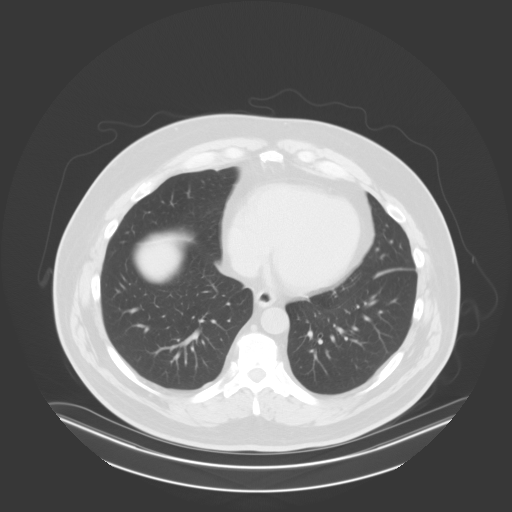

[13 of 32 positions shown; findings below may reference images not displayed]

FINDINGS: Lower chest: Focal scarring is present at the right base. Lungs are
otherwise clear. The heart size is normal.

Hepatobiliary: No focal liver abnormality is seen. No gallstones,
gallbladder wall thickening, or biliary dilatation.

Pancreas: Unremarkable. No pancreatic ductal dilatation or
surrounding inflammatory changes.

Spleen: Normal in size without focal abnormality.

Adrenals/Urinary Tract: Adrenal glands are normal bilaterally.
Low-density lesion laterally within the right kidney measures near
water density. No definite solid component is present. Similar
low-density lesion near the upper pole of the right kidney measures
2 cm. A focal fat containing lesion is noted along periphery of the
left kidney measuring up to 13 mm. Ureters are within normal limits
bilaterally. The urinary bladder is unremarkable.

Stomach/Bowel: The stomach and duodenum are within normal limits.
The small bowel is unremarkable. Terminal ileum is within normal
limits. The appendix is visualized and normal. The ascending and
transverse colon are within normal limits. Diverticular changes are
present in the distal descending colon and in the sigmoid colon
without focal inflammation.

Vascular/Lymphatic: Atherosclerotic calcifications are present in
the aorta and branch vessels without aneurysm. No significant
adenopathy is present.

Reproductive: Prostate is unremarkable.

Other: Fat herniates into the inguinal canals bilaterally, right
greater than left. A midline fat containing hernia scratched at
paraumbilical hernia measures 19 mm. Two additional ventral hernias
contain fat without bowel scratched at multiple additional ventral
hernias are present. At least 3/hernias scratched at least 3
separate ventral hernias are noted on the sagittal images. Fat
herniation is appreciated on both sagittal and axial images. No
associated bowel is herniated.

Musculoskeletal: Endplate degenerative changes are most evident at
L5-S1. Vertebral body heights are otherwise normal.
IMPRESSION: 1. At least 3 fat containing midline ventral hernias as described.
2. A separate paraumbilical hernia contains fat.
3. Fat herniates into the inguinal canals bilaterally, right greater
than left.
4. Sigmoid diverticulosis without diverticulitis.
5. Low-density lesions of the right kidney likely represent cysts.
6. 13 mm fat containing lesion along the periphery of the left
kidney compatible with a small angiomyolipoma.
7. Aortic Atherosclerosis ([id number]-JYA.A).

## 2022-12-27 DIAGNOSIS — E785 Hyperlipidemia, unspecified: Secondary | ICD-10-CM | POA: Diagnosis not present

## 2022-12-27 DIAGNOSIS — E039 Hypothyroidism, unspecified: Secondary | ICD-10-CM | POA: Diagnosis not present

## 2022-12-27 DIAGNOSIS — E1149 Type 2 diabetes mellitus with other diabetic neurological complication: Secondary | ICD-10-CM | POA: Diagnosis not present

## 2022-12-27 DIAGNOSIS — Z125 Encounter for screening for malignant neoplasm of prostate: Secondary | ICD-10-CM | POA: Diagnosis not present

## 2023-01-03 DIAGNOSIS — Z1331 Encounter for screening for depression: Secondary | ICD-10-CM | POA: Diagnosis not present

## 2023-01-03 DIAGNOSIS — Z1339 Encounter for screening examination for other mental health and behavioral disorders: Secondary | ICD-10-CM | POA: Diagnosis not present

## 2023-01-03 DIAGNOSIS — E1149 Type 2 diabetes mellitus with other diabetic neurological complication: Secondary | ICD-10-CM | POA: Diagnosis not present

## 2023-01-03 DIAGNOSIS — Z1212 Encounter for screening for malignant neoplasm of rectum: Secondary | ICD-10-CM | POA: Diagnosis not present

## 2023-01-03 DIAGNOSIS — R82998 Other abnormal findings in urine: Secondary | ICD-10-CM | POA: Diagnosis not present

## 2023-01-03 DIAGNOSIS — Z0000 Encounter for general adult medical examination without abnormal findings: Secondary | ICD-10-CM | POA: Diagnosis not present

## 2023-01-03 DIAGNOSIS — I10 Essential (primary) hypertension: Secondary | ICD-10-CM | POA: Diagnosis not present

## 2024-04-27 ENCOUNTER — Encounter (HOSPITAL_COMMUNITY): Payer: Self-pay | Admitting: General Surgery
